# Patient Record
Sex: Male | Born: 1965 | Race: White | Hispanic: No | Marital: Married | Smoking: Light tobacco smoker
Health system: Southern US, Community
[De-identification: ages and names within clinical notes are randomized; demographics above are authoritative.]

## PROBLEM LIST (undated history)

## (undated) DIAGNOSIS — K219 Gastro-esophageal reflux disease without esophagitis: Secondary | ICD-10-CM

## (undated) DIAGNOSIS — I209 Angina pectoris, unspecified: Secondary | ICD-10-CM

## (undated) DIAGNOSIS — I10 Essential (primary) hypertension: Secondary | ICD-10-CM

## (undated) DIAGNOSIS — I219 Acute myocardial infarction, unspecified: Secondary | ICD-10-CM

---

## 2001-10-03 ENCOUNTER — Encounter: Payer: Self-pay | Admitting: Family Medicine

## 2001-10-03 ENCOUNTER — Ambulatory Visit (HOSPITAL_COMMUNITY): Admission: RE | Admit: 2001-10-03 | Discharge: 2001-10-03 | Payer: Self-pay | Admitting: Family Medicine

## 2002-11-23 ENCOUNTER — Ambulatory Visit (HOSPITAL_COMMUNITY): Admission: RE | Admit: 2002-11-23 | Discharge: 2002-11-23 | Payer: Self-pay | Admitting: Family Medicine

## 2002-12-22 ENCOUNTER — Emergency Department (HOSPITAL_COMMUNITY): Admission: EM | Admit: 2002-12-22 | Discharge: 2002-12-23 | Payer: Self-pay | Admitting: *Deleted

## 2004-05-08 ENCOUNTER — Other Ambulatory Visit: Admission: RE | Admit: 2004-05-08 | Discharge: 2004-05-08 | Payer: Self-pay | Admitting: Urology

## 2004-06-16 ENCOUNTER — Ambulatory Visit: Payer: Self-pay | Admitting: Cardiology

## 2009-11-10 ENCOUNTER — Ambulatory Visit: Payer: Self-pay | Admitting: Vascular Surgery

## 2009-11-17 ENCOUNTER — Ambulatory Visit: Payer: Self-pay | Admitting: Vascular Surgery

## 2009-11-25 ENCOUNTER — Ambulatory Visit: Payer: Self-pay | Admitting: Vascular Surgery

## 2009-12-02 ENCOUNTER — Ambulatory Visit: Payer: Self-pay | Admitting: Vascular Surgery

## 2009-12-09 ENCOUNTER — Ambulatory Visit: Payer: Self-pay | Admitting: Vascular Surgery

## 2009-12-15 ENCOUNTER — Ambulatory Visit: Payer: Self-pay | Admitting: Vascular Surgery

## 2009-12-23 ENCOUNTER — Ambulatory Visit: Payer: Self-pay | Admitting: Vascular Surgery

## 2009-12-30 ENCOUNTER — Ambulatory Visit: Payer: Self-pay | Admitting: Vascular Surgery

## 2010-01-06 ENCOUNTER — Ambulatory Visit: Payer: Self-pay | Admitting: Vascular Surgery

## 2010-01-08 ENCOUNTER — Ambulatory Visit
Admission: RE | Admit: 2010-01-08 | Discharge: 2010-01-08 | Payer: Self-pay | Source: Home / Self Care | Attending: Vascular Surgery | Admitting: Vascular Surgery

## 2010-01-13 ENCOUNTER — Ambulatory Visit
Admission: RE | Admit: 2010-01-13 | Discharge: 2010-01-13 | Payer: Self-pay | Source: Home / Self Care | Attending: Vascular Surgery | Admitting: Vascular Surgery

## 2010-01-20 ENCOUNTER — Ambulatory Visit: Admit: 2010-01-20 | Payer: Self-pay | Admitting: Vascular Surgery

## 2010-01-22 ENCOUNTER — Ambulatory Visit
Admission: RE | Admit: 2010-01-22 | Discharge: 2010-01-22 | Payer: Self-pay | Source: Home / Self Care | Attending: Vascular Surgery | Admitting: Vascular Surgery

## 2010-02-09 ENCOUNTER — Ambulatory Visit
Admission: RE | Admit: 2010-02-09 | Discharge: 2010-02-09 | Payer: Self-pay | Source: Home / Self Care | Attending: Vascular Surgery | Admitting: Vascular Surgery

## 2010-02-09 NOTE — Assessment & Plan Note (Signed)
OFFICE VISIT  Cody [name], FEIG DOB:  1965/08/03                                       02/09/2010 [id number]#:[id number]  The patient had laser ablation of the right great saphenous vein with 5 stab phlebectomies in the lower calf and ankles performed today for painful varicosities and a venous stasis ulcer which had not healed over the past 12 months.  He tolerated the procedure well.  Will return in 1 week for venous duplex exam and then to schedule a similar procedure on the left side; venous duplex exam to confirm closure of the left great saphenous vein and we will then schedule the  DICTATION ENDS HERE    [name]. Hart [place], M.D. Electronically Signed  JDL/MEDQ  D:  02/09/2010  T:  02/09/2010  Job:  [id number]

## 2010-02-16 ENCOUNTER — Encounter (INDEPENDENT_AMBULATORY_CARE_PROVIDER_SITE_OTHER): Payer: 59

## 2010-02-16 ENCOUNTER — Ambulatory Visit (INDEPENDENT_AMBULATORY_CARE_PROVIDER_SITE_OTHER): Payer: 59 | Admitting: Vascular Surgery

## 2010-02-16 DIAGNOSIS — I83893 Varicose veins of bilateral lower extremities with other complications: Secondary | ICD-10-CM

## 2010-02-16 DIAGNOSIS — Z48812 Encounter for surgical aftercare following surgery on the circulatory system: Secondary | ICD-10-CM

## 2010-02-20 NOTE — Assessment & Plan Note (Signed)
OFFICE VISIT  Cody [name], Cody [name] DOB:  [birth year]                                       02/16/2010 [id number]#:[id number]  The patient returns 1 week post laser ablation of his right great saphenous vein with 5 stab phlebectomies for painful varicosities and a history of recurrent venous stasis ulcer in the right lower leg.  He has already noticed decreased edema and decreased inflammation at the stasis ulcer site.  He is still having some mild discomfort along the course of the ablation in the thigh over the great saphenous vein but that it is slightly diminishing he states.  He has been wearing his stockings and taking ibuprofen as directed.  PHYSICAL EXAMINATION:  On exam today there is mild tenderness along the course of the right great saphenous vein.  There is no distal edema noted.  The stasis ulcer which measures about a centimeter and a half in diameter is much less inflamed and more dry in appearance with no drainage.  Blood pressure today is 167/99, heart rate is 83, respirations 20.  Venous duplex exam was ordered by me and interpreted.  This reveals no evidence of DVT and complete ablation of the right great saphenous vein. In general he is doing well and will return soon to have a similar procedure performed on the left leg.    [name] Hart [place], M.D. Electronically Signed  JDL/MEDQ  D:  02/16/2010  T:  02/17/2010  Job:  [id number]

## 2010-02-20 NOTE — Procedures (Unsigned)
DUPLEX DEEP VENOUS EXAM - LOWER EXTREMITY  INDICATION:  Status post right greater saphenous vein ablation.  HISTORY:  Edema:  no Trauma/Surgery:  Status post right greater saphenous vein ablation on 02/09/2010 Pain:  yes PE:  no Previous DVT:  no Anticoagulants:  no Other:  DUPLEX EXAM:               CFV   SFV   PopV  PTV    GSV               R  L  R  L  R  L  R   L  R  L Thrombosis    O  0  O     o     o      +  0 Spontaneous   +  +  +     +     +      0  + Phasic        +  +  +     +     +      0 Augmentation  +  +  +     +     +      0 Compressible  +  +  +     +     +      0 Competent     +  0  +     +     +      +  Legend:  + - yes  o - no  p - partial  D - decreased  IMPRESSION:  No evidence of acute deep vein thrombosis.  Successful right greater saphenous vein ablation from the saphenofemoral junction to the distal insertion site.   _____________________________ [name]. Hart [place], M.D.  OD/MEDQ  D:  02/16/2010  T:  02/16/2010  Job:  [id number]

## 2010-03-23 ENCOUNTER — Other Ambulatory Visit (INDEPENDENT_AMBULATORY_CARE_PROVIDER_SITE_OTHER): Payer: 59 | Admitting: Vascular Surgery

## 2010-03-23 DIAGNOSIS — I83893 Varicose veins of bilateral lower extremities with other complications: Secondary | ICD-10-CM

## 2010-03-24 ENCOUNTER — Other Ambulatory Visit: Payer: 59 | Admitting: Vascular Surgery

## 2010-03-31 ENCOUNTER — Ambulatory Visit (INDEPENDENT_AMBULATORY_CARE_PROVIDER_SITE_OTHER): Payer: 59 | Admitting: Vascular Surgery

## 2010-03-31 ENCOUNTER — Encounter (INDEPENDENT_AMBULATORY_CARE_PROVIDER_SITE_OTHER): Payer: 59

## 2010-03-31 DIAGNOSIS — I83893 Varicose veins of bilateral lower extremities with other complications: Secondary | ICD-10-CM

## 2010-03-31 DIAGNOSIS — I872 Venous insufficiency (chronic) (peripheral): Secondary | ICD-10-CM

## 2010-03-31 DIAGNOSIS — Z48812 Encounter for surgical aftercare following surgery on the circulatory system: Secondary | ICD-10-CM

## 2010-04-01 NOTE — Assessment & Plan Note (Signed)
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  He tolerated the procedure well. This was done under local tumescent anesthesia.  He will return in 1 week for venous duplex exam to confirm closure.    [name] Hart [place], M.D. Electronically Signed  JDL/MEDQ  D:  03/30/2010  T:  03/31/2010  Job:  [id number]

## 2010-04-01 NOTE — Assessment & Plan Note (Signed)
OFFICE VISIT  ATARI, NOVICK DOB:  12-Jun-1965                                       03/31/2010 MVHQI#:[id number]  Patient returns 1 week post laser ablation of his left great saphenous vein with 10-20 stab phlebectomies for painful varicosities in the thigh and calf secondary to venous hypertension from valvular reflux in the great saphenous system.  He has had some mild to moderate discomfort along the course of the great saphenous vein from the knee to the mid thigh.  He has had no pain in the stab phlebectomy sites.  He has had no distal edema.  He has been wearing his long-leg elastic compression stocking and taking ibuprofen as instructed.  PHYSICAL EXAMINATION:  Blood pressure 153/90, heart rate 67, respirations 20.  The left leg has 3+ posterior tibial pulse with no distal edema.  There is mild to moderate tenderness along the course of the great saphenous vein in the distal to the mid thigh area.  Today I ordered a venous duplex exam which I have reviewed and interpreted.  The left great saphenous vein is totally closed, and there is no evidence of DVT.  I have reassured him regarding these problems.  He will wear the long- leg stocking on the left for 1 more week and will continue to wear a short-leg stocking on the right where he has a previous history of stasis ulcer.  Return to see [place] on a p.r.n. basis.    [name] Hart [place], M.D. Electronically Signed  JDL/MEDQ  D:  03/31/2010  T:  04/01/2010  Job:  [id number]

## 2010-04-07 NOTE — Procedures (Unsigned)
DUPLEX DEEP VENOUS EXAM - LOWER EXTREMITY  INDICATION:  Chronic venous insufficiency.  HISTORY:  Edema:  No. Trauma/Surgery:  Post endovenous laser ablation on 03/23/2010. Pain:  Yes. PE:  No. Previous DVT:  No. Anticoagulants:  No. Other:  DUPLEX EXAM:               CFV   SFV   PopV  PTV    GSV               R  L  R  L  R  L  R   L  R  L Thrombosis    o  o     o     o      o     + Spontaneous      +     +     +      +     o Phasic           +     +     +      +     o Augmentation     +     +     +      +     o Compressible     +     +     +      +     o Competent        +     +     +  Legend:  + - yes  o - no  p - partial  D - decreased  IMPRESSION: 1. No evidence of deep vein thrombosis identified in the left lower     extremity. 2. Good post ablation results the length of the greater saphenous vein     ablated, without evidence of extension into the common femoral     vein. 3. Contralateral common femoral vein is compressible and appears     patent.      _____________________________ [name]. Hart Rochester, M.D.  SH/MEDQ  D:  03/31/2010  T:  03/31/2010  Job:  [id number]

## 2010-05-26 NOTE — Assessment & Plan Note (Signed)
OFFICE VISIT   Cody [name], Cody [name]  DOB:  [birth year]                                       12/30/2009  ZOXWR#:[id number]   The patient returns today for further followup regarding his venous  insufficiency of both legs.  He continues to have a stasis ulcer on the  right ankle which has not healed with Unna boots changed weekly, in fact  he continues to have bloody drainage and ulceration in this area.  He  has aching, throbbing and burning discomfort in both legs, right worse  than left.  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  He has been wearing long-leg  elastic compression stockings and elevating his legs in the evenings and  taking ibuprofen but this has not improved the situation.   Venous duplex exam was performed today which I reviewed and interpreted.  He has gross reflux in both great saphenous veins from the  saphenofemoral junction to the knee but no DVT or reflux in the small  saphenous systems.   PHYSICAL EXAMINATION:  Vital signs:  Today blood pressure is 122/60,  heart rate 64, respirations 20.  General:  He is alert and oriented x3.  HEENT:  Exam normal for age.  Lower extremity exam reveals 3+ femoral  and dorsalis pedis pulses bilaterally.  Left leg has bulging  varicosities in the thigh and medial calf and great saphenous system  with 1+ edema.  His right leg has bulging varicosities in the medial  calf extending pretibially and there is a stasis ulcer proximal to the  medial malleolus measuring 1.5 cm in diameter with hyperpigmentation of  the skin surrounding this and lipodermatosclerosis.   This patient needs:  1. Laser ablation of the right great saphenous vein with multiple stab      phlebectomies to be followed by,  2. Laser ablation of the left great saphenous vein with multiple stab      phlebectomies.   We will proceed with precertification to perform this in the near  future  because of his worsening of the ulcer and the bleeding he has had  surrounding it and the fact that it is affecting his daily living and  ability to work.     [name] Hart Rochester, M.D.  Electronically Signed   JDL/MEDQ  D:  12/30/2009  T:  12/30/2009  Job:  [id number]

## 2010-05-26 NOTE — Procedures (Signed)
LOWER EXTREMITY VENOUS REFLUX EXAM   INDICATION:  Varicose veins.   EXAM:  Using color-flow imaging and pulse Doppler spectral analysis, the  right and left common femoral, superficial femoral, popliteal, posterior  tibial, greater and lesser saphenous veins were evaluated.  There is  evidence suggesting deep venous insufficiency in the right and left  lower extremity.   The right and left saphenofemoral junction is not competent with reflux  of >500 milliseconds.  The right and left GSV is not competent with  reflux of >500 milliseconds with the caliber as described below.   The right and left proximal short saphenous vein demonstrates  competency.   GSV Diameter (used if found to be incompetent only)                                            Right    Left  Proximal Greater Saphenous Vein           0.76 cm  0.71 cm  Proximal-to-mid-thigh                     0.75 cm  0.91 cm  Mid thigh                                 0.91 cm  0.79 cm  Mid-distal thigh                          cm       cm  Distal thigh                              0.49 cm  0.60 cm  Knee                                      cm       cm   IMPRESSION:  1. The right and left greater saphenous veins are not competent with      reflux of >500 milliseconds.  2. The right and left greater saphenous vein is not tortuous.  3. The deep venous system is not competent with reflux of >500      milliseconds.  4. The right and left short saphenous veins are competent.   ___________________________________________  [name]. Hart Rochester, M.D.   OD/MEDQ  D:  12/30/2009  T:  12/30/2009  Job:  [id number]

## 2010-05-26 NOTE — Consult Note (Signed)
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  He has tried wearing some  short-leg elastic compression stockings but has not been treated with  Unna boot or other suppression devices but has been on antibiotics  periodically.  He has no history of deep vein thrombosis or  thrombophlebitis but has noticed bulging varicosities in both legs over  the last 3-4 years with aching and throbbing discomfort as the day  progresses and swelling in the ankles and feet, right worse than left,  as the day progresses.  He is on his feet all day at work.  He does not  elevate his legs during the day because of work and does not take pain  medication.  He has had some occasional bloody drainage from the  ulceration.   CHRONIC MEDICAL PROBLEMS:  1. Hypertension.  2. Negative for diabetes, hyperlipidemia, coronary artery disease,      COPD, or stroke.   SOCIAL HISTORY:  He is married.  He has 2 children.  He does not use  tobacco, quit in 2005.  Drinks beer on a weekly basis.   PAST HISTORY:  Positive for diabetes in a grandmother.  Hypertension,  hyperlipidemia in his father.  Negative for stroke or coronary artery  disease.   REVIEW OF SYSTEMS:  Positive for occasional palpitations.  Denies any  chest pain, dyspnea on exertion, or claudication.  No productive cough,  bronchitis.  All systems in review of systems are negative.   PHYSICAL EXAMINATION:  Blood pressure 140/78, heart rate 62,  respirations 20.  Generally, he is a well-developed, well-nourished male  in no apparent distress.  Alert, oriented x3.  HEENT:  Normal for age.  EOMs intact.  Lungs are clear to auscultation.  No rhonchi or  wheezing.  Cardiovascular:  Regular rhythm.  No murmurs.  Carotid pulses are 3+.  No bruits audible.  Abdomen is soft, nontender with no masses.  Musculoskeletal:  Free of major deformities.  Neurologic:  Normal.  Skin  exam reveals an ulceration in the right leg with typical  hyperpigmentation and thickening of the skin just proximal to the medial  malleolus with the eschar over the ulcer measuring about 1.5 cm  diameter.  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  There is 1+ edema on the right.  No edema on the left.  He has bulging  varicosities in both legs of the great saphenous system from the mid  thigh down into the mid calf.  Left varicosities are more prominent than  the right.  He has 3+ femoral, popliteal, and dorsalis pedis pulses  palpable bilaterally.   Today I performed a bedside SonoSite venous duplex exam which reveals  gross reflux at the saphenofemoral junction and throughout the great  saphenous veins to the knee and proximal calf level.   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  We will try to  treat him with 3 months of long-leg elastic compression (20 mm - 30 mm  gradient) as well as elevation as much as his job will allow and  ibuprofen.  He will return in 3 months.  We will also treat him with  Unna boots on a weekly basis to try and heal this stasis ulcer on the  right.  He will require laser ablation of both great saphenous veins  with multiple stab phlebectomies in a staged fashion, beginning on the  right side.  When he returns in 3 months, he will have a formal venous  duplex performed at that time.     [name] Hart Rochester, M.D.  Electronically Signed   JDL/MEDQ  D:  11/10/2009  T:  11/10/2009  Job:  [id number]

## 2017-10-18 DIAGNOSIS — E7800 Pure hypercholesterolemia, unspecified: Secondary | ICD-10-CM | POA: Diagnosis not present

## 2017-10-18 DIAGNOSIS — Z79899 Other long term (current) drug therapy: Secondary | ICD-10-CM | POA: Diagnosis not present

## 2017-10-18 DIAGNOSIS — Z0000 Encounter for general adult medical examination without abnormal findings: Secondary | ICD-10-CM | POA: Diagnosis not present

## 2018-04-20 DIAGNOSIS — Z87891 Personal history of nicotine dependence: Secondary | ICD-10-CM | POA: Diagnosis not present

## 2018-04-20 DIAGNOSIS — Z6826 Body mass index (BMI) 26.0-26.9, adult: Secondary | ICD-10-CM | POA: Diagnosis not present

## 2018-04-20 DIAGNOSIS — Z299 Encounter for prophylactic measures, unspecified: Secondary | ICD-10-CM | POA: Diagnosis not present

## 2018-04-20 DIAGNOSIS — I10 Essential (primary) hypertension: Secondary | ICD-10-CM | POA: Diagnosis not present

## 2019-03-06 ENCOUNTER — Emergency Department (HOSPITAL_COMMUNITY): Payer: 59

## 2019-03-06 ENCOUNTER — Observation Stay (HOSPITAL_COMMUNITY)
Admission: EM | Admit: 2019-03-06 | Discharge: 2019-03-08 | Disposition: A | Discharge status: Home / Self Care | Payer: 59 | Attending: Internal Medicine | Admitting: Internal Medicine

## 2019-03-06 ENCOUNTER — Encounter (HOSPITAL_COMMUNITY): Payer: Self-pay | Admitting: Emergency Medicine

## 2019-03-06 ENCOUNTER — Other Ambulatory Visit: Payer: Self-pay

## 2019-03-06 DIAGNOSIS — R079 Chest pain, unspecified: Secondary | ICD-10-CM

## 2019-03-06 DIAGNOSIS — R778 Other specified abnormalities of plasma proteins: Secondary | ICD-10-CM

## 2019-03-06 DIAGNOSIS — R7989 Other specified abnormal findings of blood chemistry: Secondary | ICD-10-CM

## 2019-03-06 DIAGNOSIS — K219 Gastro-esophageal reflux disease without esophagitis: Secondary | ICD-10-CM | POA: Diagnosis not present

## 2019-03-06 DIAGNOSIS — Z79899 Other long term (current) drug therapy: Secondary | ICD-10-CM | POA: Insufficient documentation

## 2019-03-06 DIAGNOSIS — F17290 Nicotine dependence, other tobacco product, uncomplicated: Secondary | ICD-10-CM | POA: Diagnosis not present

## 2019-03-06 DIAGNOSIS — I10 Essential (primary) hypertension: Secondary | ICD-10-CM | POA: Insufficient documentation

## 2019-03-06 DIAGNOSIS — I252 Old myocardial infarction: Secondary | ICD-10-CM | POA: Diagnosis not present

## 2019-03-06 DIAGNOSIS — I214 Non-ST elevation (NSTEMI) myocardial infarction: Secondary | ICD-10-CM

## 2019-03-06 DIAGNOSIS — R7889 Finding of other specified substances, not normally found in blood: Secondary | ICD-10-CM

## 2019-03-06 DIAGNOSIS — M79602 Pain in left arm: Secondary | ICD-10-CM | POA: Insufficient documentation

## 2019-03-06 DIAGNOSIS — Z20822 Contact with and (suspected) exposure to covid-19: Secondary | ICD-10-CM | POA: Insufficient documentation

## 2019-03-06 HISTORY — DX: Angina pectoris, unspecified: I20.9

## 2019-03-06 HISTORY — DX: Essential (primary) hypertension: I10

## 2019-03-06 HISTORY — DX: Gastro-esophageal reflux disease without esophagitis: K21.9

## 2019-03-06 HISTORY — DX: Acute myocardial infarction, unspecified: I21.9

## 2019-03-06 LAB — BASIC METABOLIC PANEL
Anion gap: 10 (ref 5–15)
BUN: 10 mg/dL (ref 6–20)
CO2: 24 mmol/L (ref 22–32)
Calcium: 9.4 mg/dL (ref 8.9–10.3)
Chloride: 104 mmol/L (ref 98–111)
Creatinine, Ser: 1.04 mg/dL (ref 0.61–1.24)
GFR calc Af Amer: >60 mL/min (ref >60)
GFR calc non Af Amer: >60 mL/min (ref >60)
Glucose, Bld: 125 mg/dL — ABNORMAL HIGH (ref 70–99)
Potassium: 4.1 mmol/L (ref 3.5–5.1)
Sodium: 138 mmol/L (ref 135–145)

## 2019-03-06 LAB — CBC
HCT: 44.8 % (ref 39.0–52.0)
Hemoglobin: 15 g/dL (ref 13.0–17.0)
MCH: 32.7 pg (ref 26.0–34.0)
MCHC: 33.5 g/dL (ref 30.0–36.0)
MCV: 97.6 fL (ref 80.0–100.0)
Platelets: 284 10*3/uL (ref 150–400)
RBC: 4.59 MIL/uL (ref 4.22–5.81)
RDW: 12.7 % (ref 11.5–15.5)
WBC: 6.9 10*3/uL (ref 4.0–10.5)
nRBC: 0 % (ref 0.0–0.2)

## 2019-03-06 LAB — TROPONIN I (HIGH SENSITIVITY)
Troponin I (High Sensitivity): 390 ng/L (ref <18)
Troponin I (High Sensitivity): 351 ng/L (ref <18)

## 2019-03-06 LAB — RESPIRATORY PANEL BY RT PCR (FLU A&B, COVID)
Influenza A by PCR: NEGATIVE
Influenza B by PCR: NEGATIVE
SARS Coronavirus 2 by RT PCR: NEGATIVE

## 2019-03-06 MED ORDER — LISINOPRIL 10 MG PO TABS
10.0000 mg | ORAL_TABLET | Freq: Every day | ORAL | Status: DC
  Administered 2019-03-07: 10 mg via ORAL

## 2019-03-06 MED ORDER — HEPARIN BOLUS VIA INFUSION
4000.0000 [IU] | Freq: Once | INTRAVENOUS | Status: AC
  Administered 2019-03-06: 4000 [IU] via INTRAVENOUS
  Filled 2019-03-06: qty 4000

## 2019-03-06 MED ORDER — FAMOTIDINE 20 MG PO TABS
10.0000 mg | ORAL_TABLET | Freq: Every day | ORAL | Status: DC
  Administered 2019-03-07 – 2019-03-08 (×2): 10 mg via ORAL
  Filled 2019-03-06 (×2): qty 1

## 2019-03-06 MED ORDER — ASPIRIN 81 MG PO CHEW
324.0000 mg | CHEWABLE_TABLET | Freq: Once | ORAL | Status: AC
  Administered 2019-03-06: 20:00:00 324 mg via ORAL
  Filled 2019-03-06: qty 4

## 2019-03-06 MED ORDER — ONDANSETRON HCL 4 MG/2ML IJ SOLN: 4.0000 mg | Freq: Four times a day (QID) | INTRAMUSCULAR | Status: DC | PRN

## 2019-03-06 MED ORDER — HYDRALAZINE HCL 20 MG/ML IJ SOLN: 5.0000 mg | INTRAMUSCULAR | Status: DC | PRN

## 2019-03-06 MED ORDER — ACETAMINOPHEN 325 MG PO TABS: 650.0000 mg | ORAL_TABLET | ORAL | Status: DC | PRN

## 2019-03-06 MED ORDER — SODIUM CHLORIDE 0.9% FLUSH
3.0000 mL | Freq: Once | INTRAVENOUS | Status: AC
  Administered 2019-03-07: 14:00:00 3 mL via INTRAVENOUS

## 2019-03-06 MED ORDER — NITROGLYCERIN 0.4 MG SL SUBL: 0.4000 mg | SUBLINGUAL_TABLET | SUBLINGUAL | Status: DC | PRN

## 2019-03-06 MED ORDER — HEPARIN (PORCINE) 25000 UT/250ML-% IV SOLN
1000.0000 [IU]/h | INTRAVENOUS | Status: DC
  Administered 2019-03-06: 1000 [IU]/h via INTRAVENOUS
  Filled 2019-03-06: qty 250

## 2019-03-06 NOTE — ED Triage Notes (Signed)
Pt arrives to ED from home with complaints of a dull centralized chest pain that radiates to his left arm for the past month. Patient arrives to ED today bc his PCP told him his cardiac labs have came back elevated and he needed a cardiac workup to see if he's had a mild heart attack.

## 2019-03-06 NOTE — Progress Notes (Addendum)
ANTICOAGULATION CONSULT NOTE - Initial Consult  Pharmacy Consult for heparin Indication: chest pain/ACS  No Known Allergies  Patient Measurements: Height: 6\' 1"  (185.4 cm) Weight: 200 lb (90.7 kg) IBW/kg (Calculated) : 79.9  Vital Signs: Temp: 98.2 F (36.8 C) (02/23 1645) Temp Source: Oral (02/23 1645) BP: 152/85 (02/23 2130) Pulse Rate: 62 (02/23 2130)  Labs: Recent Labs    03/06/19 1702 03/06/19 1922  HGB 15.0  --   HCT 44.8  --   PLT 284  --   CREATININE 1.04  --   TROPONINIHS 390* 351*    Estimated Creatinine Clearance: 92.8 mL/min (by C-G formula based on SCr of 1.04 mg/dL).   Medical History: Past Medical History:  Diagnosis Date  . Hypertension     Assessment: 54yo male c/o dull centralized CP radiating to LUE x62mo, PCP checked labs and sent pt to ED for elevated troponin, troponin also elevated in ED, to begin heparin.  Goal of Therapy:  Heparin level 0.3-0.7 units/ml Monitor platelets by anticoagulation protocol: Yes   Plan:  Will give heparin 4000 units IV bolus x1 followed by gtt at 1000 units/hr and monitor heparin levels and CBC.  3mo, PharmD, BCPS  03/06/2019,11:21 PM   Addendum: Initial heparin level at goal, 0.31.  Will continue heparin gtt at current rate and confirm stable with additional level.   VB 03/07/2019 5:50 AM

## 2019-03-06 NOTE — Consult Note (Signed)
Cardiology Consult    Patient ID: Cody [name] MRN: [id number], DOB/AGE: 06-01-1965   Admit date: 03/06/2019 Date of Consult: 03/06/2019  Primary Physician: Monico Blitz, MD Primary Cardiologist: No primary care provider on file. Requesting Provider: Lacretia Leigh, MD  Patient Profile    Cody [name] is a 54 y.o. male with HTN and 20 pack-year smoking history. He is being seen today for the evaluation of chest pain.  History of Present Illness    Cody [name] reports about 5 weeks of pain in his left arm, primarily when he is using it such as lifting or carrying something. About 3 weeks ago he also began having a dull chest discomfort that was somewhat waxing and waning but seems to be mostly present to some degree for at least several days at a time. He still exercises without any worsening of his chest pain. 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 The pain is not pleuritic or positional. He does not have any associated symptoms such as dyspnea, nausea, diaphoresis, palpitations. He has not had similar pain previously and denies prior thoracic injuries. He was seen by his PCP yesterday who sent some blood work that was abnormal (troponin?) and he was instructed to come to the ED. He denies any known cardiac history or procedures.   Since arrival he has been mildly hypertensive. Initial troponins are 390->351. ECG shows no clear ischemic changes; there may be minimal (0.56mm at most) ST elevation in anterior leads without reciprocal changes and without dynamic changes on serial ECG.  He has been pain free while in the ED. Received full-dose aspirin.  Past Medical History   Past Medical History:  Diagnosis Date  . Hypertension     History reviewed. No pertinent surgical history.   No Known Allergies Inpatient Medications    . sodium chloride flush  3 mL Intravenous Once    Family History    History reviewed. No pertinent  family history. has no family status information on file.    Social History    Social History   Socioeconomic History  . Marital status: Married    Spouse name: Not on file  . Number of children: Not on file  . Years of education: Not on file  . Highest education level: Not on file  Occupational History  . Not on file  Tobacco Use  . Smoking status: Light Tobacco Smoker    Types: Cigars  . Smokeless tobacco: Never Used  Substance and Sexual Activity  . Alcohol use: Not on file  . Drug use: Not on file  . Sexual activity: Not on file  Other Topics Concern  . Not on file  Social History Narrative  . Not on file   Social Determinants of Health   Financial Resource Strain:   . Difficulty of Paying Living Expenses: Not on file  Food Insecurity:   . Worried About Charity fundraiser in the Last Year: Not on file  . Ran Out of Food in the Last Year: Not on file  Transportation Needs:   . Lack of Transportation (Medical): Not on file  . Lack of Transportation (Non-Medical): Not on file  Physical Activity:   . Days of Exercise per Week: Not on file  . Minutes of Exercise per Session: Not on file  Stress:   . Feeling of Stress : Not on file  Social Connections:   . Frequency of Communication  with Friends and Family: Not on file  . Frequency of Social Gatherings with Friends and Family: Not on file  . Attends Religious Services: Not on file  . Active Member of Clubs or Organizations: Not on file  . Attends Banker Meetings: Not on file  . Marital Status: Not on file  Intimate Partner Violence:   . Fear of Current or Ex-Partner: Not on file  . Emotionally Abused: Not on file  . Physically Abused: Not on file  . Sexually Abused: Not on file     Review of Systems   A comprehensive review of 10 systems was performed with pertinent positives and negatives noted in the HPI.   Physical Exam    Blood pressure (!) 152/85, pulse 62, temperature 98.2 F (36.8  C), temperature source Oral, resp. rate (!) 21, height 6\' 1"  (1.854 m), weight 90.7 kg, SpO2 100 %.    No intake or output data in the 24 hours ending 03/06/19 2157 Wt Readings from Last 3 Encounters:  03/06/19 90.7 kg   CONSTITUTIONAL: alert and conversant, well-appearing, nourished, no distress HEENT: conjunctiva normal, EOM intact, pupils equal, no lid lag. NECK: symmetric, no JVD, grossly  normal CARDIOVASCULAR: Regular rhythm. No gallop, murmur, or rub. Normal S1/S2. Radial pulses intact. JVP normal. No carotid bruits. PULMONARY/CHEST WALL: no deformities, normal breath sounds bilaterally, normal work of breathing ABDOMINAL: soft, non-tender, non-distended EXTREMITIES: no edema or muscle atrophy, warm and well-perfused SKIN: Dry and intact without apparent rashes or wounds. NEUROLOGIC: alert, no abnormal movements, cranial nerves grossly intact.   Labs    Troponin (Point of Care Test) No results for input(s): TROPIPOC in the last 72 hours. No results for input(s): CKTOTAL, CKMB, TROPONINI in the last 72 hours. Lab Results  Component Value Date   WBC 6.9 03/06/2019   HGB 15.0 03/06/2019   HCT 44.8 03/06/2019   MCV 97.6 03/06/2019   PLT 284 03/06/2019    Recent Labs  Lab 03/06/19 1702  NA 138  K 4.1  CL 104  CO2 24  BUN 10  CREATININE 1.04  CALCIUM 9.4  GLUCOSE 125*   No results found for: CHOL, HDL, LDLCALC, TRIG No results found for: [facility]   Radiology Studies    DG Chest 2 View  Result Date: 03/06/2019 CLINICAL DATA:  Chest pain EXAM: CHEST - 2 VIEW COMPARISON:  None. FINDINGS: The heart size and mediastinal contours are within normal limits. Both lungs are clear. Degenerative changes in the midthoracic spine. IMPRESSION: No active cardiopulmonary disease. Electronically Signed   By: 03/08/2019 M.D.   On: 03/06/2019 18:06    ECG & Cardiac Imaging    ECG: NSR, poor R-wave progression, minimal (<0.27mm) ST elevation in anterior leads without reciprocal  changes (note patient is also chest pain-free), possible minimal PR depression in inferior leads - personally reviewed. No prior ECG for comparison.  Assessment & Plan    Non-anginal chest pain associated with elevated troponin: Unusual presentation. All of his symptoms are very non-anginal. However, I do not have a clear explanation at this time for his troponin elevation, although trend is also not suggestive of ACS. Pain is not consistent with pericarditis, no dyspnea/hypoxia/tachycardia to suggest PE, blood pressure is only mildly elevated, CXR and labs are normal, he appears well, and is currently pain free/asymptomatic.  - Third troponin is up to 518, repeat in 6 hours, more frequently if chest pain returns - Repeat ECG and troponin if chest pain returns - TTE,  lipids, and A1c  - Aspirin only for now, reasonable to start heparin if troponin continues to rise - Nitro prn chest pain - Further recommendations pending troponin trend, TTE, and any further chest pain  HTN: mildly hypertensive. Management per primary team.   Signed, Clerance Lav, MD 03/06/2019, 9:57 PM  For questions or updates, please contact   Please consult [email or web address] for contact info under Cardiology/STEMI.

## 2019-03-06 NOTE — ED Provider Notes (Addendum)
[facility]ENC[facility] DEPARTMENT Provider Note   CSN: [id number] Arrival date & time: 03/06/19  1643     History Chief Complaint  Patient presents with  . Chest Pain    Cody [name] is a 54 y.o. male.  54 year old male presents with intermittent chest discomfort times several weeks.  Patient states that he has also had occasional left arm discomfort.  States that there has not been any associated dyspnea or diaphoresis.  No nausea with it.  Pain is not been exertional.  Symptoms seem to wax and wane at the makes them better or worse.  Saw his doctor for similar symptoms yesterday and had blood work done which was concerning and patient was sent here.  He is currently asymptomatic.  He does have a remote history of hypertension        Past Medical History:  Diagnosis Date  . Hypertension     There are no problems to display for this patient.   History reviewed. No pertinent surgical history.     History reviewed. No pertinent family history.  Social History   Tobacco Use  . Smoking status: Light Tobacco Smoker    Types: Cigars  . Smokeless tobacco: Never Used  Substance Use Topics  . Alcohol use: Not on file  . Drug use: Not on file    Home Medications Prior to Admission medications   Not on File    Allergies    Patient has no known allergies.  Review of Systems   Review of Systems  All other systems reviewed and are negative.   Physical Exam Updated Vital Signs BP (!) 156/94   Pulse 64   Temp 98.2 F (36.8 C) (Oral)   Resp 17   Ht 1.854 m (6\' 1" )   Wt 90.7 kg   SpO2 100%   BMI 26.39 kg/m   Physical Exam Vitals and nursing note reviewed.  Constitutional:      General: He is not in acute distress.    Appearance: Normal appearance. He is well-developed. He is not toxic-appearing.  HENT:     Head: Normocephalic and atraumatic.  Eyes:     General: Lids are normal.     Conjunctiva/sclera: Conjunctivae normal.     Pupils:  Pupils are equal, round, and reactive to light.  Neck:     Thyroid: No thyroid mass.     Trachea: No tracheal deviation.  Cardiovascular:     Rate and Rhythm: Normal rate and regular rhythm.     Heart sounds: Normal heart sounds. No murmur. No gallop.   Pulmonary:     Effort: Pulmonary effort is normal. No respiratory distress.     Breath sounds: Normal breath sounds. No stridor. No decreased breath sounds, wheezing, rhonchi or rales.  Abdominal:     General: Bowel sounds are normal. There is no distension.     Palpations: Abdomen is soft.     Tenderness: There is no abdominal tenderness. There is no rebound.  Musculoskeletal:        General: No tenderness. Normal range of motion.     Cervical back: Normal range of motion and neck supple.  Skin:    General: Skin is warm and dry.     Findings: No abrasion or rash.  Neurological:     Mental Status: He is alert and oriented to person, place, and time.     GCS: GCS eye subscore is 4. GCS verbal subscore is 5. GCS motor subscore is 6.  Cranial Nerves: No cranial nerve deficit.     Sensory: No sensory deficit.  Psychiatric:        Speech: Speech normal.        Behavior: Behavior normal.     ED Results / Procedures / Treatments   Labs (all labs ordered are listed, but only abnormal results are displayed) Labs Reviewed  BASIC METABOLIC PANEL - Abnormal; Notable for the following components:      Result Value   Glucose, Bld 125 (*)    All other components within normal limits  TROPONIN I (HIGH SENSITIVITY) - Abnormal; Notable for the following components:   Troponin I (High Sensitivity) 390 (*)    All other components within normal limits  RESPIRATORY PANEL BY RT PCR (FLU A&B, COVID)  CBC  TROPONIN I (HIGH SENSITIVITY)    EKG EKG Interpretation  Date/Time:  Tuesday March 06 2019 16:53:18 EST Ventricular Rate:  72 PR Interval:  158 QRS Duration: 84 QT Interval:  386 QTC Calculation: 422 R Axis:   83 Text  Interpretation: Normal sinus rhythm Cannot rule out Anterior infarct , age undetermined Abnormal ECG No old tracing to compare Confirmed by Lorre Nick ([id number]) on 03/06/2019 7:26:14 PM   Radiology DG Chest 2 View  Result Date: 03/06/2019 CLINICAL DATA:  Chest pain EXAM: CHEST - 2 VIEW COMPARISON:  None. FINDINGS: The heart size and mediastinal contours are within normal limits. Both lungs are clear. Degenerative changes in the midthoracic spine. IMPRESSION: No active cardiopulmonary disease. Electronically Signed   By: Jonna Clark M.D.   On: 03/06/2019 18:06    Procedures Procedures (including critical care time)  Medications Ordered in ED Medications  sodium chloride flush (NS) 0.9 % injection 3 mL (has no administration in time range)  aspirin chewable tablet 324 mg (has no administration in time range)    ED Course  I have reviewed the triage vital signs and the nursing notes.  Pertinent labs & imaging results that were available during my care of the patient were reviewed by me and considered in my medical decision making (see chart for details).    MDM Rules/Calculators/A&P                      Patient chest pain-free at this time.  Troponin elevated noted.  Patient likely having a NSTEMI.  Given aspirin.  Will consult cardiology  9:57 PM Patient seen by cardiology who recommends medicine admission  CRITICAL CARE Performed by: Toy Baker Total critical care time: 45 minutes Critical care time was exclusive of separately billable procedures and treating other patients. Critical care was necessary to treat or prevent imminent or life-threatening deterioration. Critical care was time spent personally by me on the following activities: development of treatment plan with patient and/or surrogate as well as nursing, discussions with consultants, evaluation of patient's response to treatment, examination of patient, obtaining history from patient or surrogate, ordering and  performing treatments and interventions, ordering and review of laboratory studies, ordering and review of radiographic studies, pulse oximetry and re-evaluation of patient's condition.  Final Clinical Impression(s) / ED Diagnoses Final diagnoses:  None    Rx / DC Orders ED Discharge Orders    None       Lorre Nick, MD 03/06/19 1942    Lorre Nick, MD 03/06/19 2157    Lorre Nick, MD 03/26/19 [id number]

## 2019-03-06 NOTE — H&P (Signed)
History and Physical    Cody [name] DOB: 12-28-1965 DOA: 03/06/2019  PCP: Kirstie Peri, MD Patient coming from: Home  Chief Complaint: Chest pain  HPI: Cody [name] is a 54 y.o. male with medical history significant of hypertension presenting to the ED for evaluation of chest pain.  Patient states for the past 5 to 6 weeks he has had episodes of left arm pain either when he is using his arm or at rest.  For the past 2 to 3 weeks he has had intermittent episodes of nonexertional substernal chest discomfort.  No associated dyspnea, diaphoresis, or nausea.  Symptoms have been worse for the past 3 days.  States he went to see his PCP yesterday who did blood work which came back abnormal and he was advised to come into the emergency room.  He was previously smoking a pack of cigarettes per day x20 years, quit smoking 16 years ago.  Currently smokes cigars.  Denies personal history of CAD or prior MI.  Denies family history of CAD.  ED Course: Hemodynamically stable.  Initial high-sensitivity troponin 390, repeat flat at 351.  EKG without acute ischemic changes.  Chest x-ray showing no active cardiopulmonary disease. Patient received aspirin 324 mg.  Review of Systems:  All systems reviewed and apart from history of presenting illness, are negative.  Past Medical History:  Diagnosis Date  . Hypertension     History reviewed. No pertinent surgical history.   reports that he has been smoking cigars. He has never used smokeless tobacco. No history on file for alcohol and drug.  No Known Allergies  History reviewed. No pertinent family history.  Prior to Admission medications   Medication Sig Start Date End Date Taking? Authorizing Provider  famotidine (PEPCID AC) 10 MG tablet Take 10 mg by mouth daily before breakfast.   Yes [provider]  lisinopril (ZESTRIL) 10 MG tablet Take 10 mg by mouth daily. 01/13/19  Yes [provider]  Omega-3 Fatty Acids (FISH  OIL PO) Take 1 capsule by mouth daily.   Yes [provider]  sildenafil (REVATIO) 20 MG tablet Take 20 mg by mouth daily as needed (for E.D.).   Yes [provider]    Physical Exam: Vitals:   03/06/19 2045 03/06/19 2100 03/06/19 2115 03/06/19 2130  BP: (!) 145/86 (!) 152/84 (!) 147/87 (!) 152/85  Pulse: 61 64 65 62  Resp: 20 19 19  (!) 21  Temp:      TempSrc:      SpO2: 100% 100% 100% 100%  Weight:      Height:        Physical Exam  Constitutional: He is oriented to person, place, and time. He appears well-developed and well-nourished. No distress.  HENT:  Head: Normocephalic.  Eyes: Right eye exhibits no discharge. Left eye exhibits no discharge.  Cardiovascular: Normal rate, regular rhythm and intact distal pulses.  Pulmonary/Chest: Effort normal and breath sounds normal. No respiratory distress. He has no wheezes. He has no rales.  Abdominal: Soft. Bowel sounds are normal. He exhibits no distension. There is no abdominal tenderness. There is no guarding.  Musculoskeletal:        General: No edema.     Cervical back: Neck supple.  Neurological: He is alert and oriented to person, place, and time.  Skin: Skin is warm and dry. He is not diaphoretic.     Labs on Admission: I have personally reviewed following labs and imaging studies  CBC: Recent  Labs  Lab 03/06/19 1702  WBC 6.9  HGB 15.0  HCT 44.8  MCV 97.6  PLT 284   Basic Metabolic Panel: Recent Labs  Lab 03/06/19 1702  NA 138  K 4.1  CL 104  CO2 24  GLUCOSE 125*  BUN 10  CREATININE 1.04  CALCIUM 9.4   GFR: Estimated Creatinine Clearance: 92.8 mL/min (by C-G formula based on SCr of 1.04 mg/dL). Liver Function Tests: No results for input(s): AST, ALT, ALKPHOS, BILITOT, PROT, ALBUMIN in the last 168 hours. No results for input(s): LIPASE, AMYLASE in the last 168 hours. No results for input(s): AMMONIA in the last 168 hours. Coagulation Profile: No results for input(s): INR, PROTIME  in the last 168 hours. Cardiac Enzymes: No results for input(s): CKTOTAL, CKMB, CKMBINDEX, TROPONINI in the last 168 hours. BNP (last 3 results) No results for input(s): PROBNP in the last 8760 hours. HbA1C: No results for input(s): HGBA1C in the last 72 hours. CBG: No results for input(s): GLUCAP in the last 168 hours. Lipid Profile: No results for input(s): CHOL, HDL, LDLCALC, TRIG, CHOLHDL, LDLDIRECT in the last 72 hours. Thyroid Function Tests: No results for input(s): TSH, T4TOTAL, FREET4, T3FREE, THYROIDAB in the last 72 hours. Anemia Panel: No results for input(s): VITAMINB12, FOLATE, FERRITIN, TIBC, IRON, RETICCTPCT in the last 72 hours. Urine analysis: No results found for: COLORURINE, APPEARANCEUR, LABSPEC, PHURINE, GLUCOSEU, HGBUR, BILIRUBINUR, KETONESUR, PROTEINUR, UROBILINOGEN, NITRITE, LEUKOCYTESUR  Radiological Exams on Admission: DG Chest 2 View  Result Date: 03/06/2019 CLINICAL DATA:  Chest pain EXAM: CHEST - 2 VIEW COMPARISON:  None. FINDINGS: The heart size and mediastinal contours are within normal limits. Both lungs are clear. Degenerative changes in the midthoracic spine. IMPRESSION: No active cardiopulmonary disease. Electronically Signed   By: Jonna Clark M.D.   On: 03/06/2019 18:06    EKG: Independently reviewed.  Sinus rhythm, Q waves in anteroseptal leads.  No prior tracing for comparison.  Assessment/Plan Principal Problem:   NSTEMI (non-ST elevated myocardial infarction) (HCC) Active Problems:   HTN (hypertension)   GERD (gastroesophageal reflux disease)   Concern for NSTEMI: History concerning for anginal symptoms for the past few weeks and has risk factors for CAD.  Initial high-sensitivity troponin 390, repeat flat at 351.  EKG without acute ischemic changes.  Currently chest pain-free. Plan: Continue cardiac monitoring.  Trend troponin.  Start heparin infusion.  Received full dose aspirin in the ED.  Sublingual nitroglycerin as needed for chest  pain.  Check lipid panel and A1c.  Will need further evaluation for ischemic heart disease.  Cardiology consulted, appreciate recommendations.  Hypertension: Blood pressure mildly elevated. Continue home lisinopril.  Hydralazine as needed for SBP >170.  GERD: Continue Pepcid.  DVT prophylaxis: Heparin Code Status: Full code Family Communication: No family available at this time. Disposition Plan: Anticipate discharge after clinical improvement. Consults called: Cardiology Admission status: It is my clinical opinion that admission to INPATIENT is reasonable and necessary in this 54 y.o. male . presenting with NSTEMI. WIll need evaluation for ischemic heart disease.  Very high risk of decompensation.  Given the aforementioned, the predictability of an adverse outcome is felt to be significant. I expect that the patient will require at least 2 midnights in the hospital to treat this condition.   The medical decision making on this patient was of high complexity and the patient is at high risk for clinical deterioration, therefore this is a level 3 visit.  John Giovanni MD [facility]  If 7PM-7AM, please contact  night-coverage [email or web address] Password [facility]  03/06/2019, 11:26 PM

## 2019-03-07 ENCOUNTER — Encounter (HOSPITAL_COMMUNITY): Payer: Self-pay | Admitting: Internal Medicine

## 2019-03-07 ENCOUNTER — Inpatient Hospital Stay (HOSPITAL_COMMUNITY): Payer: 59

## 2019-03-07 ENCOUNTER — Encounter (HOSPITAL_COMMUNITY)
Admission: EM | Disposition: A | Discharge status: Home / Self Care | Payer: Self-pay | Source: Home / Self Care | Attending: Emergency Medicine

## 2019-03-07 DIAGNOSIS — R778 Other specified abnormalities of plasma proteins: Secondary | ICD-10-CM | POA: Diagnosis not present

## 2019-03-07 DIAGNOSIS — K219 Gastro-esophageal reflux disease without esophagitis: Secondary | ICD-10-CM | POA: Diagnosis not present

## 2019-03-07 DIAGNOSIS — I158 Other secondary hypertension: Secondary | ICD-10-CM

## 2019-03-07 DIAGNOSIS — R079 Chest pain, unspecified: Secondary | ICD-10-CM | POA: Diagnosis not present

## 2019-03-07 DIAGNOSIS — Z87891 Personal history of nicotine dependence: Secondary | ICD-10-CM

## 2019-03-07 DIAGNOSIS — I214 Non-ST elevation (NSTEMI) myocardial infarction: Secondary | ICD-10-CM | POA: Diagnosis not present

## 2019-03-07 DIAGNOSIS — R7989 Other specified abnormal findings of blood chemistry: Secondary | ICD-10-CM | POA: Insufficient documentation

## 2019-03-07 LAB — ECHOCARDIOGRAM COMPLETE
Height: 73 in
Weight: 3200 oz

## 2019-03-07 LAB — CBC
HCT: 41.7 % (ref 39.0–52.0)
Hemoglobin: 14.2 g/dL (ref 13.0–17.0)
MCH: 32.8 pg (ref 26.0–34.0)
MCHC: 34.1 g/dL (ref 30.0–36.0)
MCV: 96.3 fL (ref 80.0–100.0)
Platelets: 260 10*3/uL (ref 150–400)
RBC: 4.33 MIL/uL (ref 4.22–5.81)
RDW: 12.6 % (ref 11.5–15.5)
WBC: 8.6 10*3/uL (ref 4.0–10.5)
nRBC: 0 % (ref 0.0–0.2)

## 2019-03-07 LAB — HEPARIN LEVEL (UNFRACTIONATED)
Heparin Unfractionated: 0.31 IU/mL (ref 0.30–0.70)
Heparin Unfractionated: 0.32 IU/mL (ref 0.30–0.70)

## 2019-03-07 LAB — HIV ANTIBODY (ROUTINE TESTING W REFLEX): HIV Screen 4th Generation wRfx: NONREACTIVE

## 2019-03-07 LAB — LIPID PANEL
Cholesterol: 209 mg/dL — ABNORMAL HIGH (ref 0–200)
HDL: 64 mg/dL (ref >40)
LDL Cholesterol: 116 mg/dL — ABNORMAL HIGH (ref 0–99)
Total CHOL/HDL Ratio: 3.3 RATIO
Triglycerides: 147 mg/dL (ref <150)
VLDL: 29 mg/dL (ref 0–40)

## 2019-03-07 LAB — TROPONIN I (HIGH SENSITIVITY)
Troponin I (High Sensitivity): 518 ng/L (ref <18)
Troponin I (High Sensitivity): 621 ng/L (ref <18)
Troponin I (High Sensitivity): 571 ng/L (ref <18)
Troponin I (High Sensitivity): 545 ng/L (ref <18)

## 2019-03-07 LAB — CK TOTAL AND CKMB (NOT AT ARMC)
CK, MB: 3.8 ng/mL (ref 0.5–5.0)
Relative Index: 3.6 — ABNORMAL HIGH (ref 0.0–2.5)
Total CK: 105 U/L (ref 49–397)

## 2019-03-07 LAB — HEMOGLOBIN A1C
Hgb A1c MFr Bld: 5.8 % — ABNORMAL HIGH (ref 4.8–5.6)
Mean Plasma Glucose: 119.76 mg/dL

## 2019-03-07 SURGERY — LEFT HEART CATH AND CORONARY ANGIOGRAPHY
Anesthesia: LOCAL

## 2019-03-07 MED ORDER — ENOXAPARIN SODIUM 40 MG/0.4ML ~~LOC~~ SOLN
40.0000 mg | SUBCUTANEOUS | Status: DC
  Administered 2019-03-07: 40 mg via SUBCUTANEOUS
  Filled 2019-03-07: qty 0.4

## 2019-03-07 NOTE — Progress Notes (Signed)
Progress Note  Patient Name: Cody [name] Date of Encounter: 03/07/2019  Primary Cardiologist: No primary care provider on file.   Subjective   No current chest pain or shortness of breath.  He tells me that his symptoms started about 5 weeks ago with frequent skipped heartbeats which he has had his whole adult life.  Then some discomfort down his left arm.  2 to 3 weeks ago he began having a dull chest soreness lasting for a few minutes, up to an hour.  Usually occurs at work.  He has been exercising without any exertional chest discomfort or shortness of breath.  Inpatient Medications    Scheduled Meds: . famotidine  10 mg Oral QAC breakfast  . lisinopril  10 mg Oral Daily  . sodium chloride flush  3 mL Intravenous Once   Continuous Infusions: . heparin 1,000 Units/hr (03/07/19 0753)   PRN Meds: acetaminophen, hydrALAZINE, nitroGLYCERIN, ondansetron (ZOFRAN) IV   Vital Signs    Vitals:   03/07/19 0745 03/07/19 0800 03/07/19 0815 03/07/19 0830  BP: 118/68 126/77 110/71 132/76  Pulse: 64 68 62 66  Resp: 18 (!) 21 20 20   Temp:      TempSrc:      SpO2: 97% 96% 99% 96%  Weight:      Height:       No intake or output data in the 24 hours ending 03/07/19 0835 Last 3 Weights 03/06/2019  Weight (lbs) 200 lb  Weight (kg) 90.719 kg      Telemetry    Sinus bradycardia in the 50s- Personally Reviewed  ECG    Sinus rhythm, 69 bpm, Minimal ST elevation, anterior leads - Personally Reviewed  Physical Exam   GEN: No acute distress.   Neck: No JVD Cardiac: RRR, no murmurs, rubs, or gallops.  Respiratory: Clear to auscultation bilaterally. GI: Soft, nontender, non-distended  MS: No edema; No deformity. Neuro:  Nonfocal  Psych: Normal affect   Labs    High Sensitivity Troponin:   Recent Labs  Lab 03/06/19 1922 03/06/19 2301 03/07/19 0102 03/07/19 0311 03/07/19 0448  TROPONINIHS 351* 518* 621* 571* 545*      Chemistry Recent Labs  Lab 03/06/19 1702  NA  138  K 4.1  CL 104  CO2 24  GLUCOSE 125*  BUN 10  CREATININE 1.04  CALCIUM 9.4  GFRNONAA >60  GFRAA >60  ANIONGAP 10     Hematology Recent Labs  Lab 03/06/19 1702 03/07/19 0448  WBC 6.9 8.6  RBC 4.59 4.33  HGB 15.0 14.2  HCT 44.8 41.7  MCV 97.6 96.3  MCH 32.7 32.8  MCHC 33.5 34.1  RDW 12.7 12.6  PLT 284 260    BNPNo results for input(s): BNP, PROBNP in the last 168 hours.   DDimer No results for input(s): DDIMER in the last 168 hours.   Radiology    DG Chest 2 View  Result Date: 03/06/2019 CLINICAL DATA:  Chest pain EXAM: CHEST - 2 VIEW COMPARISON:  None. FINDINGS: The heart size and mediastinal contours are within normal limits. Both lungs are clear. Degenerative changes in the midthoracic spine. IMPRESSION: No active cardiopulmonary disease. Electronically Signed   By: 03/08/2019 M.D.   On: 03/06/2019 18:06    Cardiac Studies   Echo pending  Patient Profile     54 y.o. male with a past medical history of hypertension and 20-pack-year smoking history, having quit about 15 years ago who presented with several weeks history of mild chest  discomfort, no shortness of breath, not related to activity. No prior cardiac history except pt reports has had "skipped beats" most of his life, worse with stress, not coinciding with his chest discomfort.  Assessment & Plan    Atypical chest pain with elevated troponin -Several weeks of nonexertional mild dull aching of the chest, intermittent, usually occurs while at work.  Has been able to exercise without any exertional symptoms.  -Unusual presentation for angina, however, troponins are elevated 351>518>621>571>545. -CVD risk factors include prior smoker, hypertension.  He is not overweight and exercises fairly regularly. -EKG with very minimal ST changes less than 0.5 mm anteriorly. -Patient was started on IV heparin and aspirin was given. -Plan for echocardiogram to evaluate wall motion and LV function. -Patient has  eaten a full breakfast, so testing is limited for today. -Lipids are very mildly elevated with LDL of 116.  A1c is 5.8. -Further ischemic work-up plans per Dr. Debara Pickett.  Hypertension -On lisinopril 10 mg daily continued. -Blood pressure was initially mildly elevated, improved.     For questions or updates, please contact [place] Please consult [email or web address] for contact info under        Signed, Daune Perch, NP  03/07/2019, 8:35 AM

## 2019-03-07 NOTE — Progress Notes (Addendum)
PROGRESS NOTE    Cody [name]  JAS:[id number]  DOB: 1965-05-10  PCP: Kirstie Peri, MD Admit date:03/06/2019  54 y.o. male with medical history significant of hypertension, tobacco use presenting to the ED for evaluation of chest pain-- 2 to 3 weeks he has had intermittent episodes of nonexertional substernal chest discomfort as well as left arm pain. States he went to see his PCP yesterday who did blood work which came back abnormal and he was advised to come into the emergency room. ED Course: Afebrile, hemodynamically stable.  Initial high-sensitivity troponin 390, repeat flat at 351.  EKG without acute ischemic changes.  Chest x-ray showing no active cardiopulmonary disease. Patient received aspirin 324 mg. Hospital course: Patient admitted to [facility] for further evaluation  Subjective:  Patient seen earlier this morning.  Denied any active chest pain.  Resting comfortably.  Seen by cardiology.  Objective: Vitals:   03/07/19 0745 03/07/19 0800 03/07/19 0815 03/07/19 0830  BP: 118/68 126/77 110/71 132/76  Pulse: 64 68 62 66  Resp: 18 (!) 21 20 20   Temp:      TempSrc:      SpO2: 97% 96% 99% 96%  Weight:      Height:       No intake or output data in the 24 hours ending 03/07/19 0948 Filed Weights   03/06/19 1700  Weight: 90.7 kg    Physical Examination:  General exam: Appears calm and comfortable  Respiratory system: Clear to auscultation. Respiratory effort normal. Cardiovascular system: S1 & S2 heard, RRR. No JVD, murmurs, rubs, gallops or clicks. No pedal edema. Gastrointestinal system: Abdomen is nondistended, soft and nontender. Normal bowel sounds heard. Central nervous system: Alert and oriented. No new focal neurological deficits. Extremities: No contractures, edema or joint deformities.  Skin: No rashes, lesions or ulcers Psychiatry: Judgement and insight appear normal. Mood & affect appropriate.   Data Reviewed: I have personally reviewed following labs and  imaging studies  CBC: Recent Labs  Lab 03/06/19 1702 03/07/19 0448  WBC 6.9 8.6  HGB 15.0 14.2  HCT 44.8 41.7  MCV 97.6 96.3  PLT 284 260   Basic Metabolic Panel: Recent Labs  Lab 03/06/19 1702  NA 138  K 4.1  CL 104  CO2 24  GLUCOSE 125*  BUN 10  CREATININE 1.04  CALCIUM 9.4   GFR: Estimated Creatinine Clearance: 92.8 mL/min (by C-G formula based on SCr of 1.04 mg/dL). Liver Function Tests: No results for input(s): AST, ALT, ALKPHOS, BILITOT, PROT, ALBUMIN in the last 168 hours. No results for input(s): LIPASE, AMYLASE in the last 168 hours. No results for input(s): AMMONIA in the last 168 hours. Coagulation Profile: No results for input(s): INR, PROTIME in the last 168 hours. Cardiac Enzymes: No results for input(s): CKTOTAL, CKMB, CKMBINDEX, TROPONINI in the last 168 hours. BNP (last 3 results) No results for input(s): PROBNP in the last 8760 hours. HbA1C: Recent Labs    03/07/19 0448  HGBA1C 5.8*   CBG: No results for input(s): GLUCAP in the last 168 hours. Lipid Profile: Recent Labs    03/07/19 0448  CHOL 209*  HDL 64  LDLCALC 116*  TRIG 147  CHOLHDL 3.3   Thyroid Function Tests: No results for input(s): TSH, T4TOTAL, FREET4, T3FREE, THYROIDAB in the last 72 hours. Anemia Panel: No results for input(s): VITAMINB12, FOLATE, FERRITIN, TIBC, IRON, RETICCTPCT in the last 72 hours. Sepsis Labs: No results for input(s): PROCALCITON, LATICACIDVEN in the last 168 hours.  Recent Results (from the  past 240 hour(s))  Respiratory Panel by RT PCR (Flu A&B, Covid) - Nasopharyngeal Swab     Status: None   Collection Time: 03/06/19  7:30 PM   Specimen: Nasopharyngeal Swab  Result Value Ref Range Status   SARS Coronavirus 2 by RT PCR NEGATIVE NEGATIVE Final    Comment: (NOTE) SARS-CoV-2 target nucleic acids are NOT DETECTED. The SARS-CoV-2 RNA is generally detectable in upper respiratoy specimens during the acute phase of infection. The  lowest concentration of SARS-CoV-2 viral copies this assay can detect is 131 copies/mL. A negative result does not preclude SARS-Cov-2 infection and should not be used as the sole basis for treatment or other patient management decisions. A negative result may occur with  improper specimen collection/handling, submission of specimen other than nasopharyngeal swab, presence of viral mutation(s) within the areas targeted by this assay, and inadequate number of viral copies (<131 copies/mL). A negative result must be combined with clinical observations, patient history, and epidemiological information. The expected result is Negative. Fact Sheet for Patients:  PinkCheek.be Fact Sheet for Healthcare Providers:  GravelBags.it This test is not yet ap proved or cleared by the [place] FDA and  has been authorized for detection and/or diagnosis of SARS-CoV-2 by FDA under an Emergency Use Authorization (EUA). This EUA will remain  in effect (meaning this test can be used) for the duration of the COVID-19 declaration under Section 564(b)(1) of the Act, 21 U.S.C. section 360bbb-3(b)(1), unless the authorization is terminated or revoked sooner.    Influenza A by PCR NEGATIVE NEGATIVE Final   Influenza B by PCR NEGATIVE NEGATIVE Final    Comment: (NOTE) The Xpert Xpress SARS-CoV-2/FLU/RSV assay is intended as an aid in  the diagnosis of influenza from Nasopharyngeal swab specimens and  should not be used as a sole basis for treatment. Nasal washings and  aspirates are unacceptable for Xpert Xpress SARS-CoV-2/FLU/RSV  testing. Fact Sheet for Patients: PinkCheek.be Fact Sheet for Healthcare Providers: GravelBags.it This test is not yet approved or cleared by the [place] FDA and  has been authorized for detection and/or diagnosis of SARS-CoV-2 by  FDA under an Emergency  Use Authorization (EUA). This EUA will remain  in effect (meaning this test can be used) for the duration of the  Covid-19 declaration under Section 564(b)(1) of the Act, 21  U.S.C. section 360bbb-3(b)(1), unless the authorization is  terminated or revoked. Performed at [facility], [place] [address], [place], [place] [address]       Radiology Studies: DG Chest 2 View  Result Date: 03/06/2019 CLINICAL DATA:  Chest pain EXAM: CHEST - 2 VIEW COMPARISON:  None. FINDINGS: The heart size and mediastinal contours are within normal limits. Both lungs are clear. Degenerative changes in the midthoracic spine. IMPRESSION: No active cardiopulmonary disease. Electronically Signed   By: Prudencio Pair M.D.   On: 03/06/2019 18:06        Scheduled Meds: . famotidine  10 mg Oral QAC breakfast  . lisinopril  10 mg Oral Daily  . sodium chloride flush  3 mL Intravenous Once   Continuous Infusions: . heparin 1,000 Units/hr (03/07/19 0753)    Assessment & Plan:   Assessment/Plan:  1. Chest pain, concern for ACS : Admitted with Heparin drip in concern for ACS given elevated troponin associated with substernal chest/left arm pain. Risk factors for CAD include HTN, smoking.  He was previously smoking a pack of cigarettes per day x20 years, quit smoking 16 years ago.  Currently smokes  cigars.  Denies personal history of CAD or prior MI.  Denies family history of CAD.EKG with very minimal ST changes less than 0.5 mm anteriorly. Troponin peaked to 621 and now down trending to 545 this morning.LDL of 116.  A1c is 5.8. Currently chest pain free.  Seen by cardiology and recommend CT angio vs LHC in am based on echo findings of wall motion abnormality. No plan for stress test/CTA/cath today as patient not NPO.They feel this is unusual presentation for angina, however suspect viral myocarditis as differential diagnosis.COVID/flu screen negative.   2. HTN: Continue Lisinopril. Hydralazine prn  3.  GERD:Pepcid  4. Tobacco use: States quit smoking cigarettes 16 years back but still smokes cigars about 5 times a week.  Counseled to quit.  He did not feel he needed a nicotine patch while here.   DVT prophylaxis: Heparin gtt Code Status: Full code Family / Patient Communication:  Disposition Plan:   Patient is home from prior to hospitalization. Received/Receiving inpatient evaluation for chest pain  Discharge when cardiology work up complete and off heparin gtt      LOS: 1 day    Time spent: 35 minutes    Alessandra Bevels, MD [facility] Pager in [place]  If 7PM-7AM, please contact night-coverage [email or web address] 03/07/2019, 9:48 AM

## 2019-03-07 NOTE — ED Notes (Signed)
SDU  Breakfast ordered  

## 2019-03-07 NOTE — Progress Notes (Signed)
   Echo personally reviewed - normal systolic and diastolic function. Findings do not support NSTEMI. Unclear etiology of troponin elevation. Will proceed with inpatient coronary CT angiogram tomorrow morning. Disposition based on those findings.  Chrystie Nose, MD, [facility], FACP  [place]  [facility]  Medical Director of the Advanced Lipid Disorders &  Cardiovascular Risk Reduction Clinic Diplomate of the American Board of Clinical Lipidology Attending Cardiologist  Direct Dial: [phone]  Fax: [phone]  Website:  [email or web address]

## 2019-03-07 NOTE — Progress Notes (Signed)
  Echocardiogram 2D Echocardiogram has been performed.  Cody [name] 03/07/2019, 2:32 PM

## 2019-03-07 NOTE — Progress Notes (Signed)
Heparin gtt stopped at 1516 as order was discontinued Pharmacy made aware of discontinuation of heparin gtt. Patient made aware of heparin gtt discontinuation and educated about transition to  enoxaparin injection

## 2019-03-07 NOTE — Progress Notes (Signed)
ANTICOAGULATION CONSULT NOTE  Pharmacy Consult for heparin Indication: chest pain/ACS  No Known Allergies  Patient Measurements: Height: 6\' 1"  (185.4 cm) Weight: 200 lb (90.7 kg) IBW/kg (Calculated) : 79.9  Vital Signs: BP: 132/76 (02/24 0830) Pulse Rate: 66 (02/24 0830)  Labs: Recent Labs    03/06/19 1702 03/06/19 1922 03/07/19 0102 03/07/19 0311 03/07/19 0448 03/07/19 0952  HGB 15.0  --   --   --  14.2  --   HCT 44.8  --   --   --  41.7  --   PLT 284  --   --   --  260  --   HEPARINUNFRC  --   --   --   --  0.31 0.32  CREATININE 1.04  --   --   --   --   --   TROPONINIHS 390*   < > 621* 571* 545*  --    < > = values in this interval not displayed.    Estimated Creatinine Clearance: 92.8 mL/min (by C-G formula based on SCr of 1.04 mg/dL).  Assessment: 54yo male c/o dull centralized CP radiating to LUE x11mo, PCP checked labs and sent pt to ED for elevated troponin, troponin also elevated in ED.  He continues on IV heparin. Heparin level remains therapeutic. No bleeding noted.   Goal of Therapy:  Heparin level 0.3-0.7 units/ml Monitor platelets by anticoagulation protocol: Yes   Plan:  Continue heparin gtt 1000 units/hr Daily heparin level and CBC  3mo, PharmD, BCPS Clinical Pharmacist Please see AMION for all pharmacy numbers 03/07/2019 11:22 AM

## 2019-03-08 ENCOUNTER — Inpatient Hospital Stay (HOSPITAL_COMMUNITY): Payer: 59

## 2019-03-08 ENCOUNTER — Other Ambulatory Visit: Payer: Self-pay | Admitting: Internal Medicine

## 2019-03-08 DIAGNOSIS — R778 Other specified abnormalities of plasma proteins: Secondary | ICD-10-CM

## 2019-03-08 DIAGNOSIS — I214 Non-ST elevation (NSTEMI) myocardial infarction: Secondary | ICD-10-CM | POA: Diagnosis not present

## 2019-03-08 DIAGNOSIS — K219 Gastro-esophageal reflux disease without esophagitis: Secondary | ICD-10-CM | POA: Diagnosis not present

## 2019-03-08 DIAGNOSIS — R079 Chest pain, unspecified: Secondary | ICD-10-CM | POA: Diagnosis not present

## 2019-03-08 DIAGNOSIS — I10 Essential (primary) hypertension: Secondary | ICD-10-CM

## 2019-03-08 MED ORDER — METOPROLOL TARTRATE 5 MG/5ML IV SOLN
INTRAVENOUS | Status: AC
  Filled 2019-03-08: qty 5

## 2019-03-08 MED ORDER — NITROGLYCERIN 0.4 MG SL SUBL
SUBLINGUAL_TABLET | SUBLINGUAL | Status: AC
  Administered 2019-03-08: 0.8 mg via SUBLINGUAL
  Filled 2019-03-08: qty 2

## 2019-03-08 MED ORDER — IOHEXOL 350 MG/ML SOLN
80.0000 mL | Freq: Once | INTRAVENOUS | Status: AC | PRN
  Administered 2019-03-08: 80 mL via INTRAVENOUS

## 2019-03-08 MED ORDER — ATORVASTATIN CALCIUM 20 MG PO TABS: 20.0000 mg | ORAL_TABLET | Freq: Every day | ORAL | 2 refills | Status: AC

## 2019-03-08 MED ORDER — ASPIRIN EC 81 MG PO TBEC: 81.0000 mg | DELAYED_RELEASE_TABLET | Freq: Every day | ORAL | 3 refills | Status: AC

## 2019-03-08 NOTE — Progress Notes (Signed)
   Tried to reach Cody [name] after discharge - went to voicemail. I had recommended starting low dose aspirin 81 mg daily and atorvastatin 20 mg daily, however, noted this was not included in his discharge summary by the hospitalist. I have sent a prescription for atorvastatin to his pharmacy and will try to reach him. He was instructed to follow-up with his PCP and cardiology as needed. Smoking cessation is also strongly advised.  Chrystie Nose, MD, [facility], FACP  [place]  [facility]  Medical Director of the Advanced Lipid Disorders &  Cardiovascular Risk Reduction Clinic Diplomate of the American Board of Clinical Lipidology Attending Cardiologist  Direct Dial: [phone]  Fax: [phone]  Website:  [email or web address]

## 2019-03-08 NOTE — Progress Notes (Addendum)
   Personally reviewed cardiac CT angiogram today - there is mild mis-registration artifact, but no obvious obstructive coronary disease. CAC score of 0. Overall low risk. Echo demonstrated normal systolic and diastolic function. Etiology of his troponin elevation is unclear - myocarditis unlikely - without symptoms, would not pursue cMRI. ?spontaneous dissection, but not apparent on CT (although could have been in areas of mis-registration). Overall, low risk, he has had no further symptoms - ok to discharge. Would probably recommend low dose aspirin and statin given troponin elevation. Risk factor modification per PCP at follow-up.  D/w patient and IM attending Dr. Lajuana Ripple.   Chrystie Nose, MD, [facility], FACP  [place]  [facility]  Medical Director of the Advanced Lipid Disorders &  Cardiovascular Risk Reduction Clinic Diplomate of the American Board of Clinical Lipidology Attending Cardiologist  Direct Dial: [phone]  Fax: [phone]  Website:  [email or web address]

## 2019-03-08 NOTE — Discharge Summary (Signed)
Physician Discharge Summary  Cody [name]:[id number] DOB: 12/22/1965 DOA: 03/06/2019  PCP: Kirstie Peri, MD  Admit date: 03/06/2019 Discharge date: 03/08/2019 Consultations: Cardiology Dr Rennis Golden Admitted From: home Disposition: home  Discharge Diagnoses:  Principal Problem:   Chest pain Active Problems:   HTN (hypertension)   GERD (gastroesophageal reflux disease)   Elevated troponin  Hospital Course Summary:  54 y.o.malewith medical history significant ofhypertension, tobacco use presenting to the ED for evaluation of chest pain--2 to 3 weeks he has had intermittent episodes of nonexertional substernal chest discomfort as well as left arm pain. States he went to see his PCP yesterday who did blood work which came back abnormal and he was advised to come into the emergency room. ED Course: Afebrile, hemodynamically stable. Initial high-sensitivity troponin 390,repeat flat at351.EKG without acute ischemic changes. Chest x-ray showing no active cardiopulmonary disease. Patient received aspirin 324 mg. Hospital course: Patient admitted to [facility] for further evaluation and management. Patient was admitted with Heparin drip in concern for ACS given elevated troponin associated with substernal chest/left arm pain. Risk factors for CAD include HTN, smoking. Hewas previously smoking a pack of cigarettes per day x20 years, quit smoking 16 years ago. Currently smokes cigars. Denies personal history of CAD or prior MI. Denies family history of CAD.EKG with very minimal ST changes less than 0.5 mm anteriorly. Troponin peaked to 621 and then down trending to 545 .LDL of 116. A1c is 5.8. Currently chest pain free.  Seen by cardiology who felt his presentation was not consistent with non-STEMI.  This was ruled out with CT coronary showing calcium score of 0. They feel this is unusual presentation for angina, however suspect viral myocarditis as differential diagnosis.COVID/flu screen negative.    2. HTN: Continue Lisinopril.  3. GERD:Pepcid  4. Tobacco use: States quit smoking cigarettes 16 years back but still smokes cigars about 5 times a week.  Counseled to quit.  He did not feel he needed a nicotine patch while here  Discharge Exam:  Vitals:   03/08/19 1400 03/08/19 1627  BP: 112/68 126/76  Pulse:    Resp: 18   Temp:  98.2 F (36.8 C)  SpO2: 100%    Vitals:   03/08/19 1209 03/08/19 1300 03/08/19 1400 03/08/19 1627  BP: 113/72  112/68 126/76  Pulse:      Resp:  18 18   Temp: 98.5 F (36.9 C)   98.2 F (36.8 C)  TempSrc: Oral   Oral  SpO2:   100%   Weight:      Height:        General: Pt is alert, awake, not in acute distress Cardiovascular: RRR, S1/S2 +, no rubs, no gallops Respiratory: CTA bilaterally, no wheezing, no rhonchi Abdominal: Soft, NT, ND, bowel sounds + Extremities: no edema, no cyanosis  Discharge Condition:Stable CODE STATUS: Full code Diet recommendation: low salt diet Recommendations for Outpatient Follow-up:  1. Follow up with PCP: 1 week 2. Follow up with consultants: Cardiology Dr Rennis Golden as needed 3. Please obtain follow up labs including: Cardiology did not feel there was any need to routinely repeat troponin level as outpatient.  Can consider if patient has recurrent chest pain for possible vasospastic component.  Per cardiology, can resume prior home medications including sildenafil and no new medications recommended.  Home Health services upon discharge: None Equipment/Devices upon discharge: None   Discharge Instructions:  Discharge Instructions    Call MD for:  difficulty breathing, headache or visual disturbances   Complete by:  As directed    Call MD for:  extreme fatigue   Complete by: As directed    Call MD for:  persistant dizziness or light-headedness   Complete by: As directed    Call MD for:  severe uncontrolled pain   Complete by: As directed    Call MD for:  temperature >100.4   Complete by: As directed     Diet - low sodium heart healthy   Complete by: As directed    Increase activity slowly   Complete by: As directed      Allergies as of 03/08/2019   No Known Allergies     Medication List    TAKE these medications   FISH OIL PO Take 1 capsule by mouth daily.   lisinopril 10 MG tablet Commonly known as: ZESTRIL Take 10 mg by mouth daily.   Pepcid AC 10 MG tablet Generic drug: famotidine Take 10 mg by mouth daily before breakfast.   sildenafil 20 MG tablet Commonly known as: REVATIO Take 20 mg by mouth daily as needed (for E.D.).       No Known Allergies    The results of significant diagnostics from this hospitalization (including imaging, microbiology, ancillary and laboratory) are listed below for reference.    Labs: BNP (last 3 results) No results for input(s): BNP in the last 8760 hours. Basic Metabolic Panel: Recent Labs  Lab 03/06/19 1702  NA 138  K 4.1  CL 104  CO2 24  GLUCOSE 125*  BUN 10  CREATININE 1.04  CALCIUM 9.4   Liver Function Tests: No results for input(s): AST, ALT, ALKPHOS, BILITOT, PROT, ALBUMIN in the last 168 hours. No results for input(s): LIPASE, AMYLASE in the last 168 hours. No results for input(s): AMMONIA in the last 168 hours. CBC: Recent Labs  Lab 03/06/19 1702 03/07/19 0448  WBC 6.9 8.6  HGB 15.0 14.2  HCT 44.8 41.7  MCV 97.6 96.3  PLT 284 260   Cardiac Enzymes: Recent Labs  Lab 03/07/19 0448  CKTOTAL 105  CKMB 3.8   BNP: Invalid input(s): POCBNP CBG: No results for input(s): GLUCAP in the last 168 hours. D-Dimer No results for input(s): DDIMER in the last 72 hours. Hgb A1c Recent Labs    03/07/19 0448  HGBA1C 5.8*   Lipid Profile Recent Labs    03/07/19 0448  CHOL 209*  HDL 64  LDLCALC 116*  TRIG 147  CHOLHDL 3.3   Thyroid function studies No results for input(s): TSH, T4TOTAL, T3FREE, THYROIDAB in the last 72 hours.  Invalid input(s): FREET3 Anemia work up No results for input(s):  VITAMINB12, FOLATE, FERRITIN, TIBC, IRON, RETICCTPCT in the last 72 hours. Urinalysis No results found for: COLORURINE, APPEARANCEUR, LABSPEC, PHURINE, GLUCOSEU, HGBUR, BILIRUBINUR, KETONESUR, PROTEINUR, UROBILINOGEN, NITRITE, LEUKOCYTESUR Sepsis Labs Invalid input(s): PROCALCITONIN,  WBC,  LACTICIDVEN Microbiology Recent Results (from the past 240 hour(s))  Respiratory Panel by RT PCR (Flu A&B, Covid) - Nasopharyngeal Swab     Status: None   Collection Time: 03/06/19  7:30 PM   Specimen: Nasopharyngeal Swab  Result Value Ref Range Status   SARS Coronavirus 2 by RT PCR NEGATIVE NEGATIVE Final    Comment: (NOTE) SARS-CoV-2 target nucleic acids are NOT DETECTED. The SARS-CoV-2 RNA is generally detectable in upper respiratoy specimens during the acute phase of infection. The lowest concentration of SARS-CoV-2 viral copies this assay can detect is 131 copies/mL. A negative result does not preclude SARS-Cov-2 infection and should not be used as the sole basis  for treatment or other patient management decisions. A negative result may occur with  improper specimen collection/handling, submission of specimen other than nasopharyngeal swab, presence of viral mutation(s) within the areas targeted by this assay, and inadequate number of viral copies (<131 copies/mL). A negative result must be combined with clinical observations, patient history, and epidemiological information. The expected result is Negative. Fact Sheet for Patients:  https://www.moore.com/ Fact Sheet for Healthcare Providers:  https://www.young.biz/ This test is not yet ap proved or cleared by the [place] FDA and  has been authorized for detection and/or diagnosis of SARS-CoV-2 by FDA under an Emergency Use Authorization (EUA). This EUA will remain  in effect (meaning this test can be used) for the duration of the COVID-19 declaration under Section 564(b)(1) of the Act, 21  U.S.C. section 360bbb-3(b)(1), unless the authorization is terminated or revoked sooner.    Influenza A by PCR NEGATIVE NEGATIVE Final   Influenza B by PCR NEGATIVE NEGATIVE Final    Comment: (NOTE) The Xpert Xpress SARS-CoV-2/FLU/RSV assay is intended as an aid in  the diagnosis of influenza from Nasopharyngeal swab specimens and  should not be used as a sole basis for treatment. Nasal washings and  aspirates are unacceptable for Xpert Xpress SARS-CoV-2/FLU/RSV  testing. Fact Sheet for Patients: https://www.moore.com/ Fact Sheet for Healthcare Providers: https://www.young.biz/ This test is not yet approved or cleared by the [place] FDA and  has been authorized for detection and/or diagnosis of SARS-CoV-2 by  FDA under an Emergency Use Authorization (EUA). This EUA will remain  in effect (meaning this test can be used) for the duration of the  Covid-19 declaration under Section 564(b)(1) of the Act, 21  U.S.C. section 360bbb-3(b)(1), unless the authorization is  terminated or revoked. Performed at [facility], [address] [place], [place] [address]     Procedures/Studies: DG Chest 2 View  Result Date: 03/06/2019 CLINICAL DATA:  Chest pain EXAM: CHEST - 2 VIEW COMPARISON:  None. FINDINGS: The heart size and mediastinal contours are within normal limits. Both lungs are clear. Degenerative changes in the midthoracic spine. IMPRESSION: No active cardiopulmonary disease. Electronically Signed   By: Jonna Clark M.D.   On: 03/06/2019 18:06   CT CORONARY MORPH W/CTA COR W/SCORE W/CA W/CM &/OR WO/CM  Addendum Date: 03/08/2019   ADDENDUM REPORT: 03/08/2019 16:11 HISTORY: 54 yo male with chest pain, NSTEMI/ACS suspected, single elevated troponin EXAM: Cardiac/Coronary CTA TECHNIQUE: The patient was scanned on a [employer]. PROTOCOL: A 120 kV prospective scan was triggered in the descending thoracic aorta at 111 HU's. Axial  non-contrast 3 mm slices were carried out through the heart. The data set was analyzed on a dedicated work station and scored using the Agatson method. Gantry rotation speed was 250 msecs and collimation was .6 mm. Beta blockade and 0.8 mg of sl NTG was given. The 3D data set was reconstructed in 5% intervals of the 67-82 % of the R-R cycle. Diastolic phases were analyzed on a dedicated work station using MPR, MIP and VRT modes. The patient received 19mL OMNIPAQUE IOHEXOL 350 MG/ML SOLN of contrast. FINDINGS: Quality: Good (HR 63), some patient motion artifact Coronary calcium score: The patient's coronary artery calcium score is 0, which places the patient in the 0 percentile. Coronary arteries: Normal coronary origins.  Right dominance. Right Coronary Artery: Normal vessel. Large acute marginal branch without stenosis. Left Main Coronary Artery: Normal. Bifurcates in the LAD and LCx vessels. Left Anterior Descending Coronary Artery: Coarses anteriorly  around the apex. Normal vessel - except there is misregistration artifact of a small mid-LAD segment. Left Circumflex Artery: AV groove vessel -reconstructed best diastolic views due to patient motion, there is mis-registration artifact in the mid-LCx, however, I believe not obstruction. The remainder of the LCX is normal. Large OM branch without stenosis. Aorta: Normal size, 28 mm at the mid ascending aorta (level of the PA bifurcation) measured double oblique. No calcifications. No dissection. Aortic Valve: Trileaflet.  No calcifications. Other findings: Normal pulmonary vein drainage into the left atrium. Normal left atrial appendage without a thrombus. Normal size of the pulmonary artery. IMPRESSION: 1. Notwithstanding small areas of mis-registration artifact, there is no evidence of CAD, CADRADS = 0. 2. Coronary calcium score of 0. This was 0 percentile for age and sex matched control. 3. Normal coronary origin with right dominance. Electronically Signed   By:  Chrystie Nose M.D.   On: 03/08/2019 16:11   Result Date: 03/08/2019 EXAM: OVER-READ INTERPRETATION  CT CHEST The following report is an over-read performed by radiologist Dr. Irish Lack of [facility] on 03/08/2019. This over-read does not include interpretation of cardiac or coronary anatomy or pathology. The coronary CTA interpretation by the cardiologist is attached. COMPARISON:  None. FINDINGS: Vascular: No incidental findings. Mediastinum/Nodes: No enlarged mediastinal or axillary lymph nodes. Thyroid gland, trachea, and esophagus demonstrate no significant findings. Lungs/Pleura: Visualized lungs show no evidence of pulmonary edema, consolidation, pneumothorax, nodule or pleural fluid. Upper Abdomen: No acute abnormality. Musculoskeletal: No chest wall mass or suspicious bone lesions identified. IMPRESSION: Normal over-read examination. Electronically Signed: By: Irish Lack M.D. On: 03/08/2019 14:18   ECHOCARDIOGRAM COMPLETE  Result Date: 03/07/2019    ECHOCARDIOGRAM REPORT   Patient Name:   Cody [name] Date of Exam: 03/07/2019 Medical Rec #:  [id number]     Height:       73.0 in Accession #:    [id number]    Weight:       200.0 lb Date of Birth:  [birth year]     BSA:          2.152 m Patient Age:    53 years      BP:           134/75 mmHg Patient Gender: M             HR:           65 bpm. Exam Location:  Inpatient Procedure: 2D Echo, Cardiac Doppler and Color Doppler Indications:    Chest pain  History:        Patient has no prior history of Echocardiogram examinations.                 Previous Myocardial Infarction; Risk Factors:Hypertension and                 Current Smoker. Elevated Troponin.  Sonographer:    Lavenia Atlas Referring Phys: [id number] Campbell Lerner HAMMOND IMPRESSIONS  1. Left ventricular ejection fraction, by estimation, is 55 to 60%. The left ventricle has normal function. The left ventricle has no regional wall motion abnormalities. Left ventricular diastolic  parameters were normal.  2. Right ventricular systolic function is normal. The right ventricular size is normal. There is normal pulmonary artery systolic pressure. The estimated right ventricular systolic pressure is 19.8 mmHg.  3. The mitral valve is grossly normal. Trivial mitral valve regurgitation.  4. The aortic valve is tricuspid. Aortic valve regurgitation is not visualized. No aortic stenosis is present.  5. The inferior vena cava is normal in size with greater than 50% respiratory variability, suggesting right atrial pressure of 3 mmHg. FINDINGS  Left Ventricle: Left ventricular ejection fraction, by estimation, is 55 to 60%. The left ventricle has normal function. The left ventricle has no regional wall motion abnormalities. The left ventricular internal cavity size was normal in size. There is  no left ventricular hypertrophy. Left ventricular diastolic parameters were normal. Right Ventricle: The right ventricular size is normal. No increase in right ventricular wall thickness. Right ventricular systolic function is normal. There is normal pulmonary artery systolic pressure. The tricuspid regurgitant velocity is 2.05 m/s, and  with an assumed right atrial pressure of 3 mmHg, the estimated right ventricular systolic pressure is 19.8 mmHg. Left Atrium: Left atrial size was normal in size. Right Atrium: Right atrial size was normal in size. Pericardium: Trivial pericardial effusion is present. Presence of pericardial fat pad. Mitral Valve: The mitral valve is grossly normal. Trivial mitral valve regurgitation. Tricuspid Valve: The tricuspid valve is grossly normal. Tricuspid valve regurgitation is trivial. Aortic Valve: The aortic valve is tricuspid. Aortic valve regurgitation is not visualized. No aortic stenosis is present. Pulmonic Valve: The pulmonic valve was grossly normal. Pulmonic valve regurgitation is not visualized. Aorta: The aortic root is normal in size and structure. Venous: The inferior  vena cava is normal in size with greater than 50% respiratory variability, suggesting right atrial pressure of 3 mmHg. IAS/Shunts: No atrial level shunt detected by color flow Doppler.  LEFT VENTRICLE PLAX 2D LVIDd:         5.20 cm  Diastology LVIDs:         3.30 cm  LV e' lateral:   12.50 cm/s LV PW:         1.10 cm  LV E/e' lateral: 7.7 LV IVS:        0.80 cm  LV e' medial:    7.62 cm/s LVOT diam:     2.00 cm  LV E/e' medial:  12.7 LV SV:         71 LV SV Index:   33 LVOT Area:     3.14 cm  RIGHT VENTRICLE RV Basal diam:  4.00 cm RV S prime:     15.20 cm/s TAPSE (M-mode): 2.7 cm LEFT ATRIUM             Index       RIGHT ATRIUM           Index LA diam:        3.30 cm 1.53 cm/m  RA Area:     18.80 cm LA Vol (A2C):   27.6 ml 12.83 ml/m RA Volume:   57.60 ml  26.77 ml/m LA Vol (A4C):   43.9 ml 20.40 ml/m LA Biplane Vol: 34.7 ml 16.13 ml/m  AORTIC VALVE LVOT Vmax:   98.60 cm/s LVOT Vmean:  64.400 cm/s LVOT VTI:    0.226 m  AORTA Ao Root diam: 2.90 cm MITRAL VALVE               TRICUSPID VALVE MV Area (PHT): 3.37 cm    TR Peak grad:   16.8 mmHg MV Decel Time: 225 msec    TR Vmax:        205.00 cm/s MV E velocity: 96.40 cm/s MV A velocity: 90.40 cm/s  SHUNTS MV E/A ratio:  1.07        Systemic VTI:  0.23 m  Systemic Diam: 2.00 cm Eleonore Chiquito MD Electronically signed by Eleonore Chiquito MD Signature Date/Time: 03/07/2019/2:54:06 PM    Final     Time coordinating discharge: Over 30 minutes  SIGNED:   Guilford Shi, MD  [facility] 03/08/2019, 4:30 PM

## 2019-03-08 NOTE — Plan of Care (Signed)
  Problem: Activity: Goal: Ability to return to baseline activity level will improve Outcome: Progressing   Problem: Education: Goal: Understanding of CV disease, CV risk reduction, and recovery process will improve Outcome: Progressing   Problem: Cardiovascular: Goal: Vascular access site(s) Level 0-1 will be maintained Outcome: Progressing

## 2021-04-11 IMAGING — CR DG CHEST 2V
2 series · 2 of 2 positions shown · non-contrast
Comparison: None.

CLINICAL DATA: Chest pain

EXAM:
CHEST - 2 VIEW

[chest pa]
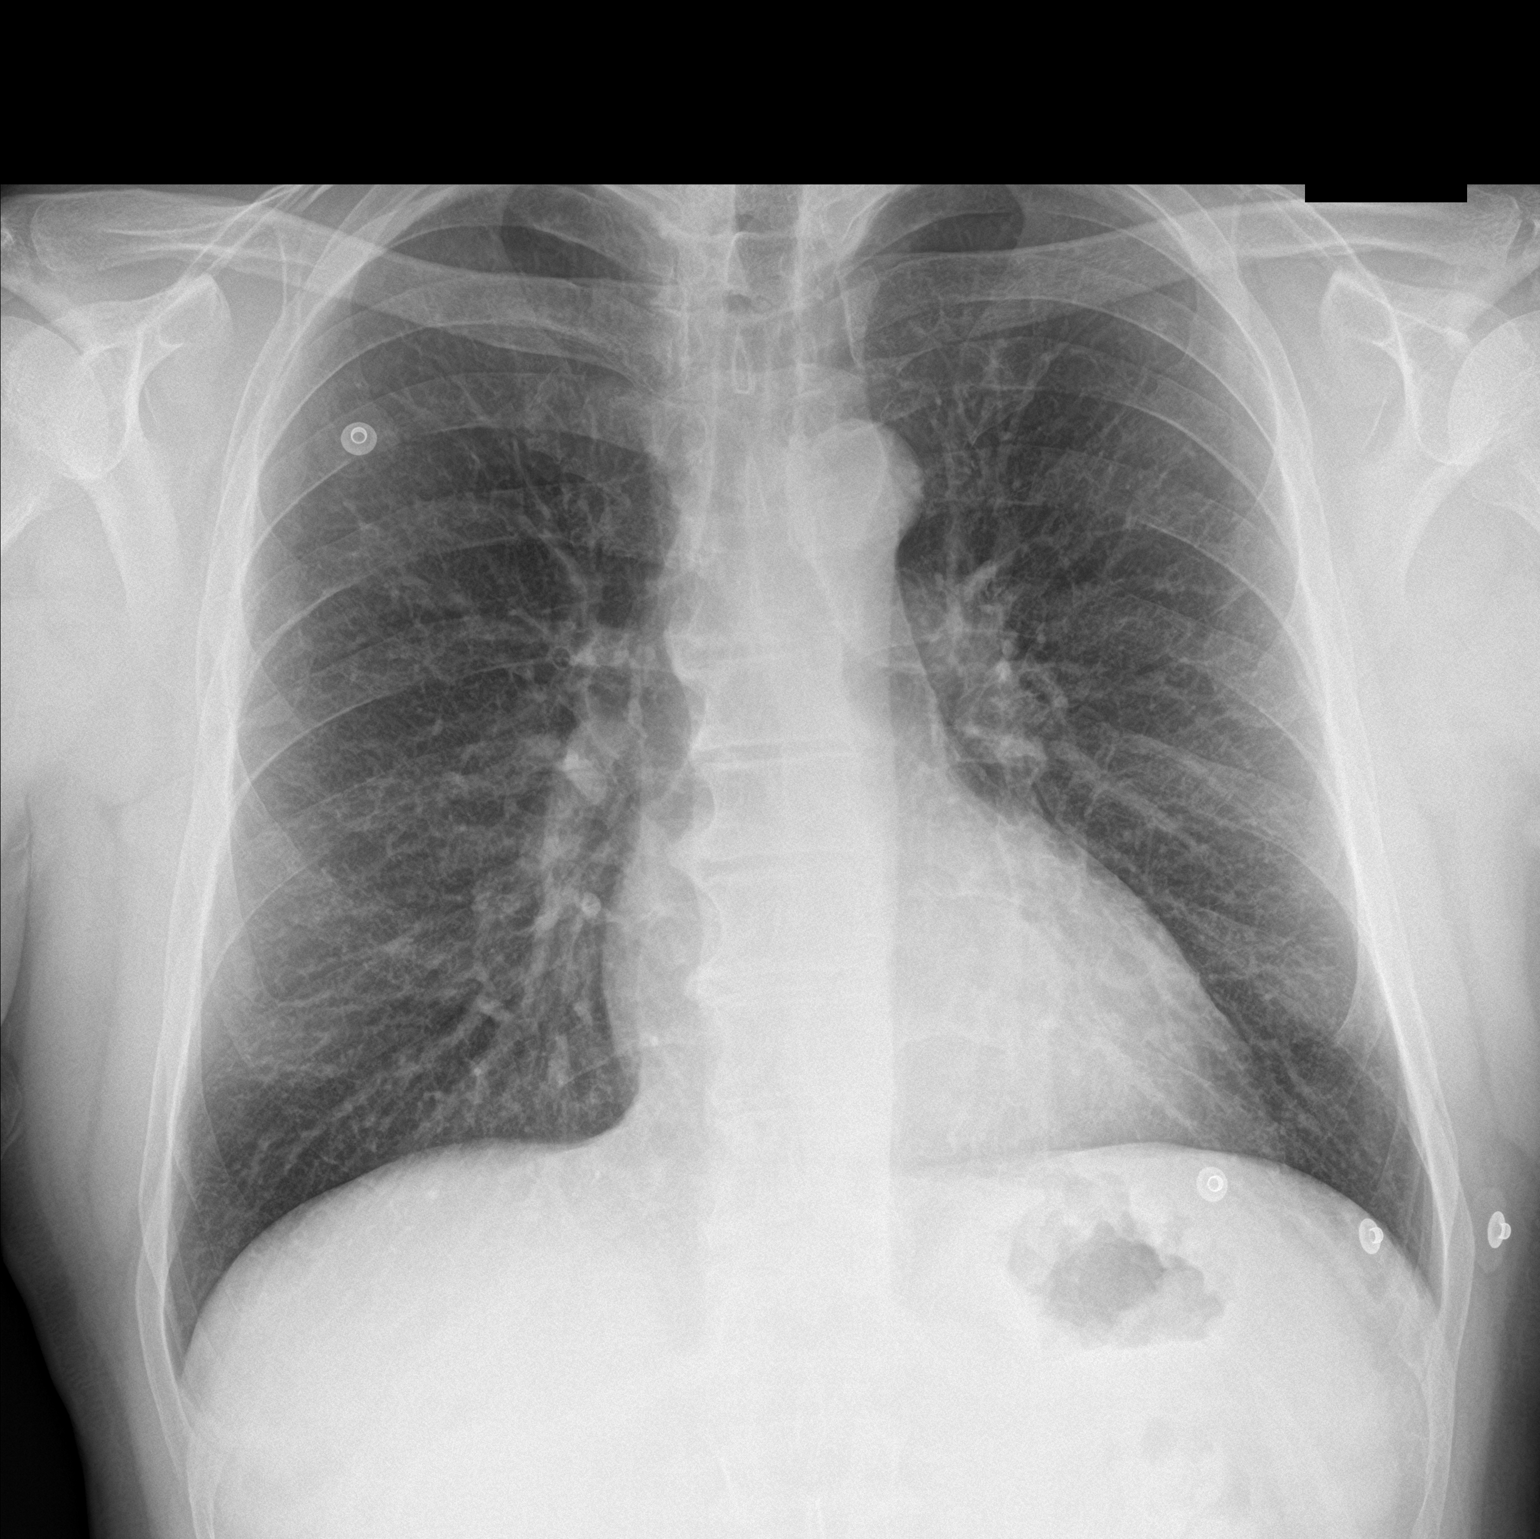

[chest lat]
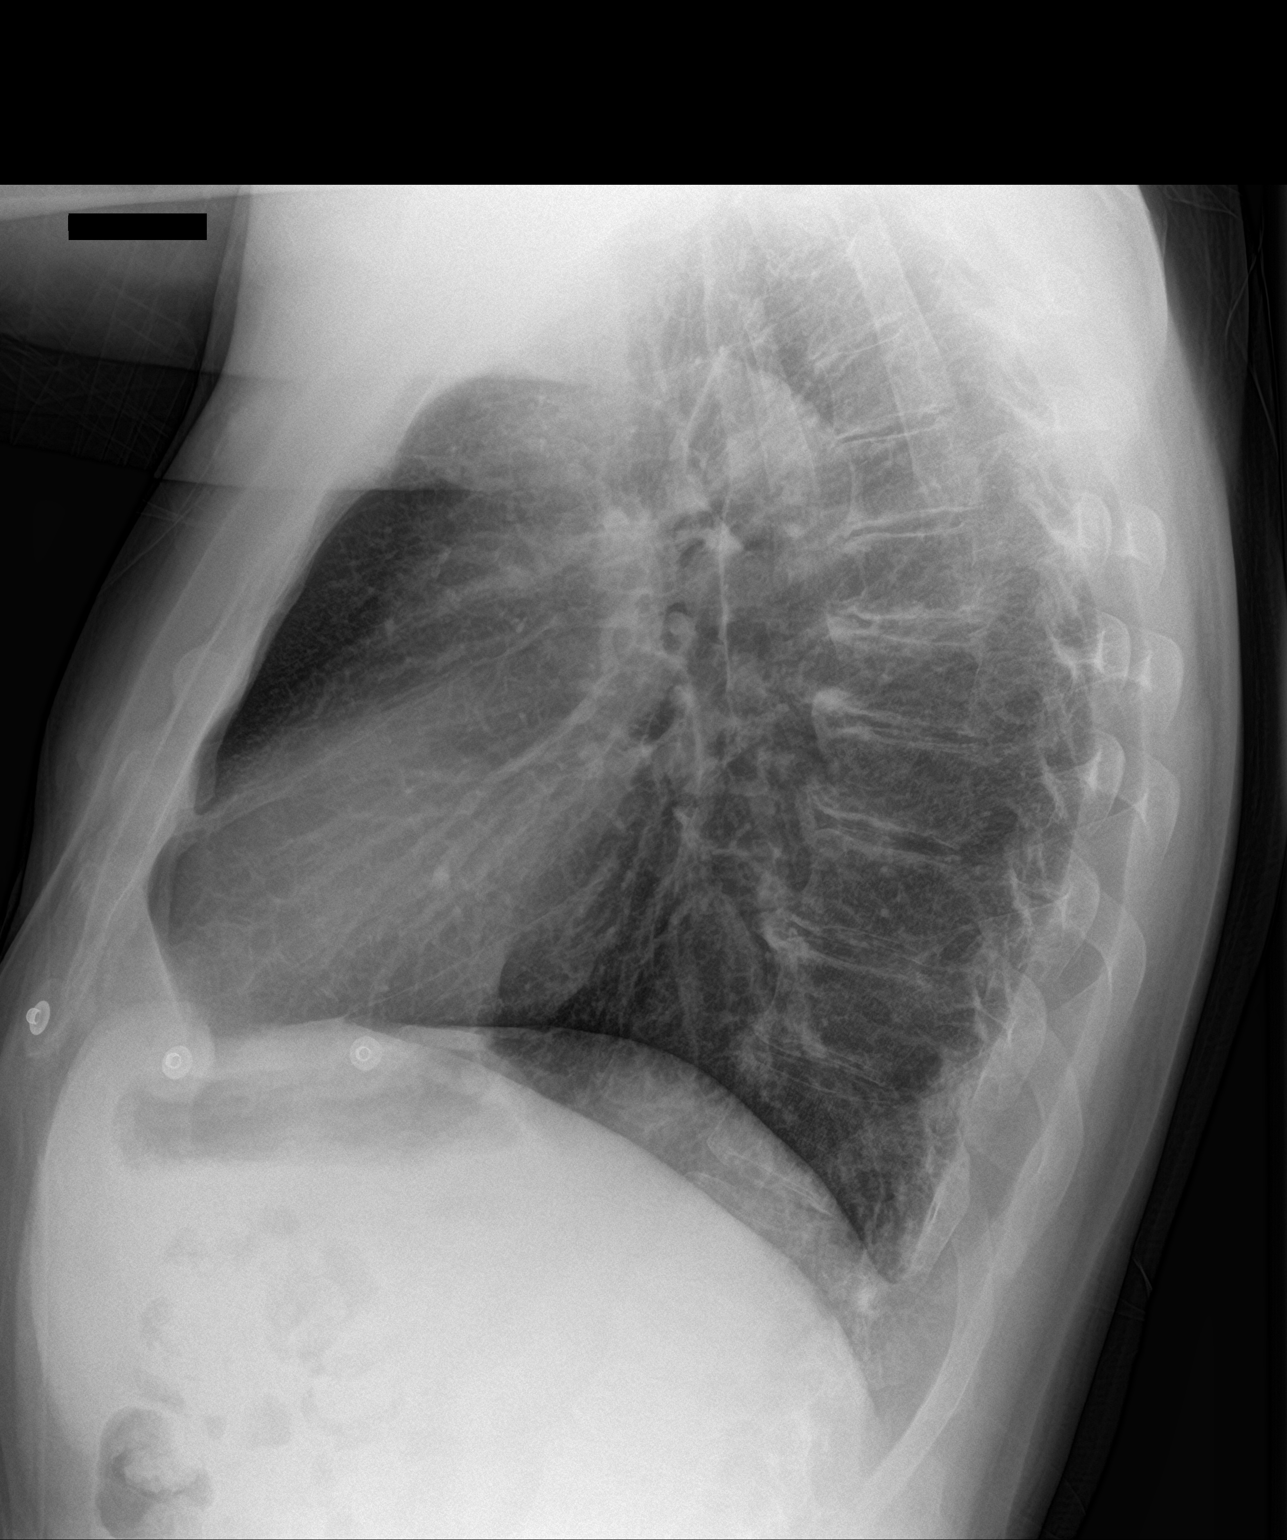

[2 of 2 positions shown; findings below may reference images not displayed]

FINDINGS: The heart size and mediastinal contours are within normal limits.
Both lungs are clear. Degenerative changes in the midthoracic spine.
IMPRESSION: No active cardiopulmonary disease.

## 2022-09-07 ENCOUNTER — Other Ambulatory Visit: Payer: Self-pay | Admitting: *Deleted

## 2022-09-07 DIAGNOSIS — I83893 Varicose veins of bilateral lower extremities with other complications: Secondary | ICD-10-CM

## 2022-09-20 ENCOUNTER — Ambulatory Visit (HOSPITAL_COMMUNITY)
Admission: RE | Admit: 2022-09-20 | Discharge: 2022-09-20 | Disposition: A | Discharge status: Home / Self Care | Payer: 59 | Source: Ambulatory Visit | Attending: Surgery | Admitting: Surgery

## 2022-09-20 ENCOUNTER — Ambulatory Visit: Payer: 59 | Admitting: Physician Assistant

## 2022-09-20 VITALS — BP 132/80 | HR 68 | Temp 98.3°F | Resp 18 | Ht 73.0 in | Wt 207.2 lb

## 2022-09-20 DIAGNOSIS — I83893 Varicose veins of bilateral lower extremities with other complications: Secondary | ICD-10-CM | POA: Insufficient documentation

## 2022-09-20 NOTE — Progress Notes (Signed)
Office Note     CC:  follow up Requesting Provider:  Kirstie Peri, MD  HPI: Cody [name] is a 57 y.o. ([birth year]) male who presents for evaluation of surface veins in the right lower extremity.  He has history of right greater saphenous vein ablation by Dr. Hart Rochester in 2012.  He experienced bleeding while in the shower from a surface vein at the end of July.  He was able to achieve hemostasis with compression and elevation.  The following day the same area began to bleed profusely again.  He has been wrapping his leg circumflex really in this area since that time.  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  He has gotten out of the habit of wearing compression socks.  He elevates his leg when he gets home during the day.  He denies any DVTs or venous ulcerations.   Past Medical History:  Diagnosis Date   Anginal pain (HCC)    GERD (gastroesophageal reflux disease)    Hypertension    Myocardial infarction [facility])     History reviewed. No pertinent surgical history.  Social History   Socioeconomic History   Marital status: Married    Spouse name: Not on file   Number of children: Not on file   Years of education: Not on file   Highest education level: Not on file  Occupational History   Not on file  Tobacco Use   Smoking status: Light Smoker    Types: Cigars   Smokeless tobacco: Never   Tobacco comments:    saldom use   Substance and Sexual Activity   Alcohol use: Not Currently   Drug use: Never   Sexual activity: Yes  Other Topics Concern   Not on file  Social History Narrative   Not on file   Social Determinants of Health   Financial Resource Strain: Not on file  Food Insecurity: Not on file  Transportation Needs: Not on file  Physical Activity: Not on file  Stress: Not on file  Social Connections: Not on file  Intimate Partner Violence: Not on file   History reviewed. No pertinent family history.  Current Outpatient Medications   Medication Sig Dispense Refill   aspirin EC 81 MG tablet Take 1 tablet (81 mg total) by mouth daily. 90 tablet 3   famotidine (PEPCID AC) 10 MG tablet Take 10 mg by mouth daily before breakfast.     lisinopril (ZESTRIL) 10 MG tablet Take 10 mg by mouth daily.     Omega-3 Fatty Acids (FISH OIL PO) Take 1 capsule by mouth daily.     sildenafil (REVATIO) 20 MG tablet Take 20 mg by mouth daily as needed (for E.D.).     atorvastatin (LIPITOR) 20 MG tablet Take 1 tablet (20 mg total) by mouth daily. 30 tablet 2   No current facility-administered medications for this visit.    No Known Allergies   REVIEW OF SYSTEMS:   [X]  denotes positive finding, [ ]  denotes negative finding Cardiac  Comments:  Chest pain or chest pressure:    Shortness of breath upon exertion:    Short of breath when lying flat:    Irregular heart rhythm:        Vascular    Pain in calf, thigh, or hip brought on by ambulation:    Pain in feet at night that wakes you up from your sleep:     Blood clot in your veins:    Leg swelling:  Pulmonary    Oxygen at home:    Productive cough:     Wheezing:         Neurologic    Sudden weakness in arms or legs:     Sudden numbness in arms or legs:     Sudden onset of difficulty speaking or slurred speech:    Temporary loss of vision in one eye:     Problems with dizziness:         Gastrointestinal    Blood in stool:     Vomited blood:         Genitourinary    Burning when urinating:     Blood in urine:        Psychiatric    Major depression:         Hematologic    Bleeding problems:    Problems with blood clotting too easily:        Skin    Rashes or ulcers:        Constitutional    Fever or chills:      PHYSICAL EXAMINATION:  Vitals:   09/20/22 1031  BP: 132/80  Pulse: 68  Resp: 18  Temp: 98.3 F (36.8 C)  TempSrc: Temporal  SpO2: 98%  Weight: 207 lb 3.2 oz (94 kg)  Height: 6\' 1"  (1.854 m)    General:  WDWN in NAD; vital signs  documented above Gait: Not observed HENT: WNL, normocephalic Pulmonary: normal non-labored breathing , without Rales, rhonchi,  wheezing Cardiac: regular HR Abdomen: soft, NT, no masses Skin: without rashes Extremities: Area of bleeding surface vein completely healed; no sign of bleeding currently he does have pigmentation changes in the medial shin of the right lower leg; no venous ulcerations Musculoskeletal: no muscle wasting or atrophy  Neurologic: A&O X 3 Psychiatric:  The pt has Normal affect.   Non-Invasive Vascular Imaging:   Right lower extremity venous reflux study negative for DVT GSV is absent in the thigh consistent with prior ablation He does have common femoral vein reflux as well as greater saphenous vein reflux at the knee and in the proximal calf.  ASSESSMENT/PLAN:: 57 y.o. male here for follow up for evaluation for bleeding surface vein of the right lower extremity   -[name] is a 56 year old male with history of right greater saphenous vein ablation by Dr. Hart Rochester in 2012.  About 6 weeks ago he experienced bleeding from a surface vein on his right lower medial shin.  This area bled again the following day.  He has been wrapping this area with nonadhesive bandage and Coban on a daily basis and has not experienced any further bleeding.  On exam this area has completely healed.  I have recommended getting back in the habit of wearing 15 to 20 mmHg knee-high compression socks on a daily basis.  He should also periodically elevate his legs above the level of the heart throughout the day.  He will also try to avoid prolonged standing and sitting as much as possible.  I provided him with sclerotherapy nurse Morton Amy card should he have a bleeding episode in the future.  For now he will follow-up on an as-needed basis.   Emilie Rutter, PA-C [facility] [phone]  Clinic MD:   Myra Gianotti
